# Patient Record
Sex: Male | Born: 1990
Health system: Southern US, Community
[De-identification: ages and names within clinical notes are randomized; demographics above are authoritative.]

## PROBLEM LIST (undated history)

## (undated) DIAGNOSIS — Z789 Other specified health status: Secondary | ICD-10-CM

## (undated) DIAGNOSIS — D649 Anemia, unspecified: Secondary | ICD-10-CM

## (undated) HISTORY — PX: NO PAST SURGERIES: SHX2092

---
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
## 2014-08-18 ENCOUNTER — Emergency Department (HOSPITAL_COMMUNITY)
Admission: EM | Admit: 2014-08-18 | Discharge: 2014-08-18 | Disposition: A | Discharge status: Home / Self Care | Payer: Self-pay | Attending: Emergency Medicine | Admitting: Emergency Medicine

## 2014-08-18 ENCOUNTER — Encounter (HOSPITAL_COMMUNITY): Payer: Self-pay | Admitting: Emergency Medicine

## 2014-08-18 DIAGNOSIS — Z792 Long term (current) use of antibiotics: Secondary | ICD-10-CM | POA: Insufficient documentation

## 2014-08-18 DIAGNOSIS — Y9389 Activity, other specified: Secondary | ICD-10-CM | POA: Insufficient documentation

## 2014-08-18 DIAGNOSIS — K089 Disorder of teeth and supporting structures, unspecified: Secondary | ICD-10-CM | POA: Insufficient documentation

## 2014-08-18 DIAGNOSIS — Y998 Other external cause status: Secondary | ICD-10-CM | POA: Insufficient documentation

## 2014-08-18 DIAGNOSIS — Y929 Unspecified place or not applicable: Secondary | ICD-10-CM | POA: Insufficient documentation

## 2014-08-18 DIAGNOSIS — S0081XA Abrasion of other part of head, initial encounter: Secondary | ICD-10-CM | POA: Insufficient documentation

## 2014-08-18 DIAGNOSIS — W108XXA Fall (on) (from) other stairs and steps, initial encounter: Secondary | ICD-10-CM | POA: Insufficient documentation

## 2014-08-18 DIAGNOSIS — Z720 Tobacco use: Secondary | ICD-10-CM | POA: Insufficient documentation

## 2014-08-18 MED ORDER — CEPHALEXIN 500 MG PO CAPS: 500.0000 mg | ORAL_CAPSULE | Freq: Four times a day (QID) | ORAL | Status: DC

## 2014-08-18 NOTE — ED Notes (Signed)
Pt walked to lobby and stated "I am going to get my family member."

## 2014-08-18 NOTE — ED Provider Notes (Signed)
CSN: [id number]     Arrival date & time 08/18/14  1928 History  This chart was scribed for non-physician practitioner, Dierdre ForthHannah Devlyn Retter, PA-C working with Toy CookeyMegan Docherty, MD by Gwenyth Oberatherine Macek, ED scribe. This patient was seen in room TR09C/TR09C and the patient's care was started at 8:22 PM   Chief Complaint  Patient presents with  . Facial Injury   The history is provided by the patient and medical records. No language interpreter was used.   HPI Comments: Ryan CanaryRonald Warnick is a 23 y.o. male who presents to the Emergency Department complaining of a left lip injury and abrasions after he fell on some steps and hit his head at 2am this morning. Pt notes abrasions to the left side of his face and chipping his front incisor as a result of the fall. He has cleaned the laceration with water, but has not tried any other treatments PTA. Pt's last tetanus shot was 5 months ago when he was imprisoned. He denies LOC, neck pain, back pain, head pain and changes in vision as associated symptoms.    History reviewed. No pertinent past medical history. History reviewed. No pertinent past surgical history. No family history on file. History  Substance Use Topics  . Smoking status: Current Every Day Smoker  . Smokeless tobacco: Not on file  . Alcohol Use: No    Review of Systems  Constitutional: Negative for fever, diaphoresis, appetite change, fatigue and unexpected weight change.  HENT: Positive for dental problem. Negative for mouth sores.   Eyes: Negative for visual disturbance.  Respiratory: Negative for cough, chest tightness, shortness of breath and wheezing.   Cardiovascular: Negative for chest pain.  Gastrointestinal: Negative for nausea, vomiting, abdominal pain, diarrhea and constipation.  Endocrine: Negative for polydipsia, polyphagia and polyuria.  Genitourinary: Negative for dysuria, urgency, frequency and hematuria.  Musculoskeletal: Negative for back pain and neck stiffness.  Skin:  Positive for wound. Negative for rash.  Allergic/Immunologic: Negative for immunocompromised state.  Neurological: Negative for syncope, light-headedness and headaches.  Hematological: Does not bruise/bleed easily.  Psychiatric/Behavioral: Negative for sleep disturbance. The patient is not nervous/anxious.   All other systems reviewed and are negative.     Allergies  Review of patient's allergies indicates no known allergies.  Home Medications   Prior to Admission medications   Medication Sig Start Date End Date Taking? Authorizing Provider  cephALEXin (KEFLEX) 500 MG capsule Take 1 capsule (500 mg total) by mouth 4 (four) times daily. 08/18/14   Demir Titsworth, PA-C   BP 134/77 mmHg  Pulse 99  Temp(Src) 99 F (37.2 C) (Oral)  Resp 17  Ht 5\' 11"  (1.803 m)  Wt 140 lb (63.504 kg)  BMI 19.53 kg/m2  SpO2 100% Physical Exam  Constitutional: He is oriented to person, place, and time. He appears well-developed and well-nourished. No distress.  HENT:  Head: Normocephalic and atraumatic.  Right Ear: Tympanic membrane, external ear and ear canal normal.  Left Ear: Tympanic membrane, external ear and ear canal normal.  Nose: Nose normal. No epistaxis. Right sinus exhibits no maxillary sinus tenderness and no frontal sinus tenderness. Left sinus exhibits no maxillary sinus tenderness and no frontal sinus tenderness.  Mouth/Throat: Uvula is midline, oropharynx is clear and moist and mucous membranes are normal. Mucous membranes are not pale and not cyanotic. No oropharyngeal exudate, posterior oropharyngeal edema, posterior oropharyngeal erythema or tonsillar abscesses.  Left front incisor chipped, but not loose Abrasions to the left side of the for head, left cheek, left lower  lip and central chin  Eyes: Conjunctivae and EOM are normal. Pupils are equal, round, and reactive to light. No scleral icterus.  No horizontal, vertical or rotational nystagmus  Neck: Normal range of motion  and full passive range of motion without pain. Neck supple.  Full active and passive ROM without pain No midline or paraspinal tenderness No nuchal rigidity or meningeal signs  Cardiovascular: Normal rate, regular rhythm, normal heart sounds and intact distal pulses.   No murmur heard. Pulmonary/Chest: Effort normal and breath sounds normal. No stridor. No respiratory distress. He has no wheezes. He has no rales.  Clear and equal breath sounds without focal wheezes, rhonchi, rales  Abdominal: Soft. Bowel sounds are normal. There is no tenderness. There is no rebound and no guarding.  Musculoskeletal: Normal range of motion.  Lymphadenopathy:    He has no cervical adenopathy.  Neurological: He is alert and oriented to person, place, and time. He has normal reflexes. No cranial nerve deficit. He exhibits normal muscle tone. Coordination normal.  Mental Status:  Alert, oriented, thought content appropriate. Speech fluent without evidence of aphasia. Able to follow 2 step commands without difficulty.  Cranial Nerves:  II:  Peripheral visual fields grossly normal, pupils equal, round, reactive to light III,IV, VI: ptosis not present, extra-ocular motions intact bilaterally  V,VII: smile symmetric, facial light touch sensation equal VIII: hearing grossly normal bilaterally  IX,X: gag reflex present  XI: bilateral shoulder shrug equal and strong XII: midline tongue extension  Motor:  5/5 in upper and lower extremities bilaterally including strong and equal grip strength and dorsiflexion/plantar flexion Sensory: Pinprick and light touch normal in all extremities.  Deep Tendon Reflexes: 2+ and symmetric  Cerebellar: normal finger-to-nose with bilateral upper extremities Gait: normal gait and balance CV: distal pulses palpable throughout  No clonus  Skin: Skin is warm and dry. No rash noted. He is not diaphoretic.  Psychiatric: He has a normal mood and affect. His behavior is normal. Judgment  and thought content normal.  Nursing note and vitals reviewed.   ED Course  Procedures (including critical care time) DIAGNOSTIC STUDIES: Oxygen Saturation is 100% on RA, normal by my interpretation.    COORDINATION OF CARE: 8:28 PM Discussed treatment plan with pt at bedside and pt agreed to plan.    Labs Review Labs Reviewed - No data to display  Imaging Review No results found.   EKG Interpretation None      MDM   Final diagnoses:  Abrasion of face, initial encounter   Ryan Cooper presents with abrasions to the left side of face after fall at 2 AM. Patient requests stitches for his left lower lip however there is no laceration for which stretching would be available.  Reports his tetanus is up-to-date. Will give Keflex to ensure no infection to the lip abrasion as it appears macerated.  Neurologic exam. Patient denies neck or back pain. Patient walks without difficulty and no focal neurologic deficits.  I have personally reviewed patient's vitals, nursing note and any pertinent labs or imaging.  I performed an focused physical exam; undressed when appropriate .    It has been determined that no acute conditions requiring further emergency intervention are present at this time. The patient/guardian have been advised of the diagnosis and plan. I reviewed any labs and imaging including any potential incidental findings. We have discussed signs and symptoms that warrant return to the ED and they are listed in the discharge instructions.    Vital signs are  stable at discharge.   BP 134/77 mmHg  Pulse 99  Temp(Src) 99 F (37.2 C) (Oral)  Resp 17  Ht 5\' 11"  (1.803 m)  Wt 140 lb (63.504 kg)  BMI 19.53 kg/m2  SpO2 100%  I personally performed the services described in this documentation, which was scribed in my presence. The recorded information has been reviewed and is accurate.       Dahlia ClientHannah Velma Hanna, PA-C 08/18/14 [id number]  Toy CookeyMegan Docherty, MD 08/19/14 [phone]

## 2014-08-18 NOTE — ED Notes (Signed)
Pt did not return from lobby.

## 2014-08-18 NOTE — ED Notes (Signed)
Pt. lost his balanced/tripped while going down steps and fell yesterday hit his face against pavement , no LOC /ambulatory , presents with abrasions at left forehead/left cheek and lower lip abrasion - no bleeding .

## 2014-08-18 NOTE — Discharge Instructions (Signed)
1. Medications: keflex, usual home medications 2. Treatment: rest, drink plenty of fluids,  3. Follow Up: Please followup with your primary doctor in 3 days for discussion of your diagnoses and further evaluation after today's visit; if you do not have a primary care doctor use the resource guide provided to find one; Please return to the ER for signs of infection    Abrasion An abrasion is a cut or scrape of the skin. Abrasions do not extend through all layers of the skin and most heal within 10 days. It is important to care for your abrasion properly to prevent infection. CAUSES  Most abrasions are caused by falling on, or gliding across, the ground or other surface. When your skin rubs on something, the outer and inner layer of skin rubs off, causing an abrasion. DIAGNOSIS  Your caregiver will be able to diagnose an abrasion during a physical exam.  TREATMENT  Your treatment depends on how large and deep the abrasion is. Generally, your abrasion will be cleaned with water and a mild soap to remove any dirt or debris. An antibiotic ointment may be put over the abrasion to prevent an infection. A bandage (dressing) may be wrapped around the abrasion to keep it from getting dirty.  You may need a tetanus shot if:  You cannot remember when you had your last tetanus shot.  You have never had a tetanus shot.  The injury broke your skin. If you get a tetanus shot, your arm may swell, get red, and feel warm to the touch. This is common and not a problem. If you need a tetanus shot and you choose not to have one, there is a rare chance of getting tetanus. Sickness from tetanus can be serious.  HOME CARE INSTRUCTIONS   If a dressing was applied, change it at least once a day or as directed by your caregiver. If the bandage sticks, soak it off with warm water.   Wash the area with water and a mild soap to remove all the ointment 2 times a day. Rinse off the soap and pat the area dry with a clean  towel.   Reapply any ointment as directed by your caregiver. This will help prevent infection and keep the bandage from sticking. Use gauze over the wound and under the dressing to help keep the bandage from sticking.   Change your dressing right away if it becomes wet or dirty.   Only take over-the-counter or prescription medicines for pain, discomfort, or fever as directed by your caregiver.   Follow up with your caregiver within 24-48 hours for a wound check, or as directed. If you were not given a wound-check appointment, look closely at your abrasion for redness, swelling, or pus. These are signs of infection. SEEK IMMEDIATE MEDICAL CARE IF:   You have increasing pain in the wound.   You have redness, swelling, or tenderness around the wound.   You have pus coming from the wound.   You have a fever or persistent symptoms for more than 2-3 days.  You have a fever and your symptoms suddenly get worse.  You have a bad smell coming from the wound or dressing.  MAKE SURE YOU:   Understand these instructions.  Will watch your condition.  Will get help right away if you are not doing well or get worse. Document Released: 05/16/2005 Document Revised: 07/23/2012 Document Reviewed: 07/10/2011 [facility] Patient Information 2015 [place] [place]. This information is not intended to replace advice given  to you by your health care provider. Make sure you discuss any questions you have with your health care provider.  

## 2017-01-15 ENCOUNTER — Encounter (HOSPITAL_COMMUNITY): Payer: Self-pay | Admitting: Emergency Medicine

## 2017-01-15 ENCOUNTER — Emergency Department (HOSPITAL_COMMUNITY)
Admission: EM | Admit: 2017-01-15 | Discharge: 2017-01-16 | Disposition: A | Discharge status: Home / Self Care | Payer: Self-pay | Attending: Emergency Medicine | Admitting: Emergency Medicine

## 2017-01-15 DIAGNOSIS — Z23 Encounter for immunization: Secondary | ICD-10-CM | POA: Insufficient documentation

## 2017-01-15 DIAGNOSIS — F17200 Nicotine dependence, unspecified, uncomplicated: Secondary | ICD-10-CM | POA: Insufficient documentation

## 2017-01-15 DIAGNOSIS — L720 Epidermal cyst: Secondary | ICD-10-CM | POA: Insufficient documentation

## 2017-01-15 MED ORDER — LIDOCAINE HCL (PF) 1 % IJ SOLN
5.0000 mL | Freq: Once | INTRAMUSCULAR | Status: AC
  Administered 2017-01-15: 5 mL
  Filled 2017-01-15: qty 5

## 2017-01-15 MED ORDER — TETANUS-DIPHTH-ACELL PERTUSSIS 5-2.5-18.5 LF-MCG/0.5 IM SUSP
0.5000 mL | Freq: Once | INTRAMUSCULAR | Status: AC
  Administered 2017-01-15: 0.5 mL via INTRAMUSCULAR
  Filled 2017-01-15: qty 0.5

## 2017-01-15 NOTE — Discharge Instructions (Signed)
We have drained the cyst on your face. Keep the area clean using warm water and antibacterial soap. Return for any problems.

## 2017-01-15 NOTE — ED Provider Notes (Signed)
[facility]-EMERGENCY DEPT Provider Note   CSN: [id number] Arrival date & time: 01/15/17  2143  By signing my name below, I, Rosana Fret, attest that this documentation has been prepared under the direction and in the presence of non-physician practitioner, Kerrie Buffalo, NP. Electronically Signed: Rosana Fret, ED Scribe. 01/15/17. 10:54 PM.  History   Chief Complaint Chief Complaint  Patient presents with  . Abscess   The history is provided by the patient. No language interpreter was used.  Abscess  Location:  Head/neck Size:  1 cm  Abscess quality: fluctuance   Red streaking: no   Progression:  Worsening Chronicity:  New Context: insect bite/sting   Relieved by:  Nothing Ineffective treatments:  None tried Associated symptoms: no fever, no nausea and no vomiting    HPI Comments: Ryan Cooper Sites is a 26 y.o. male who presents to the Emergency Department complaining of a moderate, gradually worsening area of pain and swelling to the left side of his face onset a couple of days ago. Pt believes he may have been bit by an insect. Pt states pain is exacerbated with palpation and direct pressure. No treatments tried prior to arrival in the ED. Pt denies drainage from the area, eye pain or any other complaints at this time.  History reviewed. No pertinent past medical history.  There are no active problems to display for this patient.   History reviewed. No pertinent surgical history.     Home Medications    Prior to Admission medications   Medication Sig Start Date End Date Taking? Authorizing Provider  cephALEXin (KEFLEX) 500 MG capsule Take 1 capsule (500 mg total) by mouth 4 (four) times daily. 08/18/14   Muthersbaugh, Dahlia Client, PA-C    Family History No family history on file.  Social History Social History  Substance Use Topics  . Smoking status: Current Every Day Smoker  . Smokeless tobacco: Never Used  . Alcohol use No     Allergies   Patient has no known  allergies.   Review of Systems Review of Systems  Constitutional: Negative for fever.  Eyes: Negative for pain.  Gastrointestinal: Negative for nausea and vomiting.  Skin: Positive for wound.     Physical Exam Updated Vital Signs BP 129/82   Pulse 84   Temp 99.5 F (37.5 C) (Oral)   Resp 16   Ht 5\' 11"  (1.803 m)   Wt 63.5 kg (140 lb)   SpO2 97%   BMI 19.53 kg/m   Physical Exam  Constitutional: He is oriented to person, place, and time. He appears well-developed and well-nourished. No distress.  HENT:  Head: Normocephalic and atraumatic.  Eyes: EOM are normal.  Neck: Neck supple.  Cardiovascular: Normal rate.   Pulmonary/Chest: Effort normal.  Neurological: He is alert and oriented to person, place, and time.  Skin: Skin is warm and dry.  2 raised fluctuant areas that measure 1 cm each to the left side of the face at the cheek bone.   Psychiatric: He has a normal mood and affect. His behavior is normal.  Nursing note and vitals reviewed.    ED Treatments / Results  DIAGNOSTIC STUDIES: Oxygen Saturation is 97% on RA, normal by my interpretation.   COORDINATION OF CARE: 10:39 PM-Discussed next steps with pt including aspiration and treatment with antibiotics. Pt verbalized understanding and is agreeable with the plan.   Labs (all labs ordered are listed, but only abnormal results are displayed) Labs Reviewed - No data to display  EKG  EKG Interpretation None       Radiology No results found.  Procedures .Marland Kitchen.Incision and Drainage Date/Time: 01/15/2017 10:54 PM Performed by: Janne NapoleonNEESE, HOPE M Authorized by: Janne NapoleonNEESE, HOPE M   Consent:    Consent obtained:  Verbal   Consent given by:  Patient   Risks discussed:  Bleeding, incomplete drainage, infection and pain   Alternatives discussed:  No treatment Location:    Type:  Abscess   Size:  1 cm, 1 cm    Location:  Head   Head location:  Face Pre-procedure details:    Skin preparation:  Antiseptic  wash Anesthesia (see MAR for exact dosages):    Anesthesia method:  Local infiltration   Local anesthetic:  Lidocaine 1% w/o epi Procedure type:    Complexity:  Complex Procedure details:    Needle aspiration: yes     Needle gauge: 27 G.   Incision types:  Single straight   Incision depth:  Subcutaneous   Scalpel blade:  11   Wound management:  Probed and deloculated   Drainage:  Purulent   Drainage amount:  Moderate (thick cheesy drainage)   Wound treatment:  Wound left open   Packing materials:  None Post-procedure details:    Patient tolerance of procedure:  Tolerated well, no immediate complications     (including critical care time)  Medications Ordered in ED Medications  lidocaine (PF) (XYLOCAINE) 1 % injection 5 mL (not administered)  Tdap (BOOSTRIX) injection 0.5 mL (not administered)     Initial Impression / Assessment and Plan / ED Course  I have reviewed the triage vital signs and the nursing notes.  Final Clinical Impressions(s) / ED Diagnoses  26 y.o. male with cystic area to the left side of his face stable for d/c after area drained.   Final diagnoses:  Epidermal cyst of face    New Prescriptions New Prescriptions   No medications on file  I personally performed the services described in this documentation, which was scribed in my presence. The recorded information has been reviewed and is accurate.    Kerrie Buffaloeese, Hope HurleyM, TexasNP 01/15/17 2312    Jacalyn LefevreHaviland, Julie, MD 01/15/17 [phone]

## 2017-01-15 NOTE — ED Triage Notes (Signed)
Pt to ED with c/o two small bumps on left side of face x's 1 1/2 months.  Pt st's painful when he washes his face.  No drainage noted

## 2017-02-01 ENCOUNTER — Encounter (HOSPITAL_COMMUNITY): Payer: Self-pay

## 2017-02-01 ENCOUNTER — Emergency Department (HOSPITAL_COMMUNITY)
Admission: EM | Admit: 2017-02-01 | Discharge: 2017-02-01 | Disposition: A | Discharge status: Home / Self Care | Payer: Self-pay | Attending: Emergency Medicine | Admitting: Emergency Medicine

## 2017-02-01 DIAGNOSIS — M65051 Abscess of tendon sheath, right thigh: Secondary | ICD-10-CM | POA: Insufficient documentation

## 2017-02-01 DIAGNOSIS — Z79899 Other long term (current) drug therapy: Secondary | ICD-10-CM | POA: Insufficient documentation

## 2017-02-01 DIAGNOSIS — L0291 Cutaneous abscess, unspecified: Secondary | ICD-10-CM

## 2017-02-01 DIAGNOSIS — F17200 Nicotine dependence, unspecified, uncomplicated: Secondary | ICD-10-CM | POA: Insufficient documentation

## 2017-02-01 MED ORDER — AMOXICILLIN-POT CLAVULANATE 875-125 MG PO TABS: 1.0000 | ORAL_TABLET | Freq: Two times a day (BID) | ORAL | 0 refills | Status: DC

## 2017-02-01 MED ORDER — LIDOCAINE-EPINEPHRINE (PF) 2 %-1:200000 IJ SOLN
5.0000 mL | Freq: Once | INTRAMUSCULAR | Status: AC
  Administered 2017-02-01: 5 mL
  Filled 2017-02-01: qty 20

## 2017-02-01 NOTE — Discharge Instructions (Signed)
Medications: Augmentin  Treatment: Take Augmentin as prescribed until you finish all medication. Keep dressing applied at all times. Change dressing 1-2 times daily after washing the wound with warm soapy water.  Follow-up: Please return to emergency Department in 2 days for recheck of your wound. Please return sooner if he develop any fever, increasing pain, redness, swelling, red streaking from the area.

## 2017-02-01 NOTE — ED Provider Notes (Signed)
[facility]PT Provider Note   CSN: [id number] Arrival date & time: 02/01/17  0757     History   Chief Complaint Chief Complaint  Patient presents with  . Abscess    HPI Ryan Cooper is a 26 y.o. male who presents with a 2 day history of abscess to right inner thigh. Patient states that area has grown in size and has become more painful over the past 2 days. He reports having history of abscesses in the past. He denies any fevers, chest pain, shortness of breath, abdominal pain, nausea, vomiting, urinary symptoms. He is not tried anything at home for his symptoms.  HPI  History reviewed. No pertinent past medical history.  There are no active problems to display for this patient.   History reviewed. No pertinent surgical history.     Home Medications    Prior to Admission medications   Medication Sig Start Date End Date Taking? Authorizing Provider  amoxicillin-clavulanate (AUGMENTIN) 875-125 MG tablet Take 1 tablet by mouth every 12 (twelve) hours. 02/01/17   Henri Baumler, Waylan BogaAlexandra M, PA-C  cephALEXin (KEFLEX) 500 MG capsule Take 1 capsule (500 mg total) by mouth 4 (four) times daily. 08/18/14   Muthersbaugh, Dahlia ClientHannah, PA-C    Family History No family history on file.  Social History Social History  Substance Use Topics  . Smoking status: Current Every Day Smoker  . Smokeless tobacco: Never Used  . Alcohol use No     Allergies   Patient has no known allergies.   Review of Systems Review of Systems  Constitutional: Negative for chills and fever.  HENT: Negative for facial swelling.   Respiratory: Negative for shortness of breath.   Cardiovascular: Negative for chest pain.  Gastrointestinal: Negative for abdominal pain, nausea and vomiting.  Genitourinary: Negative for scrotal swelling and testicular pain.  Skin: Positive for wound. Negative for rash.  Psychiatric/Behavioral: The patient is not nervous/anxious.      Physical Exam Updated Vital Signs BP  122/72   Pulse 76   Temp 97.5 F (36.4 C)   Resp 18   Ht 6' (1.829 m)   Wt 68 kg (150 lb)   SpO2 98%   BMI 20.34 kg/m   Physical Exam  Constitutional: He appears well-developed and well-nourished. No distress.  HENT:  Head: Normocephalic and atraumatic.  Eyes: Conjunctivae are normal. Right eye exhibits no discharge. Left eye exhibits no discharge. No scleral icterus.  Neck: Normal range of motion. Neck supple. No thyromegaly present.  Cardiovascular: Normal rate, regular rhythm, normal heart sounds and intact distal pulses.  Exam reveals no gallop and no friction rub.   No murmur heard. Pulmonary/Chest: Effort normal and breath sounds normal. No stridor. No respiratory distress. He has no wheezes. He has no rales.  Abdominal: Soft. Bowel sounds are normal. He exhibits no distension. There is no tenderness. There is no rebound and no guarding.  Genitourinary: Testes normal and penis normal.  Musculoskeletal: He exhibits no edema.  Neurological: He is alert. Coordination normal.  Skin: Skin is warm and dry. No rash noted. He is not diaphoretic. No pallor.  1.5 cm area of tenderness and erythema  Psychiatric: He has a normal mood and affect.  Nursing note and vitals reviewed.    ED Treatments / Results  Labs (all labs ordered are listed, but only abnormal results are displayed) Labs Reviewed - No data to display  EKG  EKG Interpretation None       Radiology No results found.  Procedures Procedures (  including critical care time)  INCISION AND DRAINAGE Performed by: Emi Holes Consent: Verbal consent obtained. Risks and benefits: risks, benefits and alternatives were discussed Type: abscess  Body area: R medial thigh  Anesthesia: local infiltration  Incision was made with a scalpel.  Local anesthetic: lidocaine 2% w/ epinephrine  Anesthetic total: 1 ml  Complexity: complex Blunt dissection to break up loculations  Drainage: purulent  Drainage  amount: moderate  Packing material: none  Patient tolerance: Patient tolerated the procedure well with no immediate complications.    Medications Ordered in ED Medications  lidocaine-EPINEPHrine (XYLOCAINE W/EPI) 2 %-1:200000 (PF) injection 5 mL (5 mLs Infiltration Given by Other 02/01/17 0949)     Initial Impression / Assessment and Plan / ED Course  I have reviewed the triage vital signs and the nursing notes.  Pertinent labs & imaging results that were available during my care of the patient were reviewed by me and considered in my medical decision making (see chart for details).     Patient with skin abscess. No involvement of scrotum. Incision and drainage performed in the ED today.  Abscess was not large enough to warrant packing or drain placement. Wound recheck in 2 days. Supportive care and return precautions discussed.  Pt sent home with Augmentin for anaerobe coverage. The patient appears reasonably screened and/or stabilized for discharge and I doubt any other emergent medical condition requiring further screening, evaluation, or treatment in the ED prior to discharge. I discussed patient case with Dr. Madilyn Hook who guided the patient's management and agrees with plan.   Final Clinical Impressions(s) / ED Diagnoses   Final diagnoses:  Abscess    New Prescriptions Discharge Medication List as of 02/01/2017  9:44 AM    START taking these medications   Details  amoxicillin-clavulanate (AUGMENTIN) 875-125 MG tablet Take 1 tablet by mouth every 12 (twelve) hours., Starting Fri 02/01/2017, Print         Silvanna Ohmer, Peter, PA-C 02/01/17 1111    Tilden Fossa, MD 02/02/17 1031

## 2017-02-01 NOTE — ED Triage Notes (Signed)
Pt reports abscess to right medial thigh that he noticed yesterday

## 2017-08-05 ENCOUNTER — Ambulatory Visit (HOSPITAL_COMMUNITY)
Admission: EM | Admit: 2017-08-05 | Discharge: 2017-08-05 | Disposition: A | Discharge status: Home / Self Care | Payer: Self-pay | Attending: Family Medicine | Admitting: Family Medicine

## 2017-08-05 ENCOUNTER — Encounter (HOSPITAL_COMMUNITY): Payer: Self-pay | Admitting: *Deleted

## 2017-08-05 ENCOUNTER — Other Ambulatory Visit: Payer: Self-pay

## 2017-08-05 DIAGNOSIS — Z202 Contact with and (suspected) exposure to infections with a predominantly sexual mode of transmission: Secondary | ICD-10-CM | POA: Insufficient documentation

## 2017-08-05 DIAGNOSIS — F17200 Nicotine dependence, unspecified, uncomplicated: Secondary | ICD-10-CM | POA: Insufficient documentation

## 2017-08-05 MED ORDER — LIDOCAINE HCL (PF) 1 % IJ SOLN
INTRAMUSCULAR | Status: AC
  Filled 2017-08-05: qty 2

## 2017-08-05 MED ORDER — CEFTRIAXONE SODIUM 250 MG IJ SOLR
250.0000 mg | Freq: Once | INTRAMUSCULAR | Status: AC
  Administered 2017-08-05: 250 mg via INTRAMUSCULAR

## 2017-08-05 MED ORDER — AZITHROMYCIN 250 MG PO TABS
ORAL_TABLET | ORAL | Status: AC
  Filled 2017-08-05: qty 4

## 2017-08-05 MED ORDER — CEFTRIAXONE SODIUM 250 MG IJ SOLR
INTRAMUSCULAR | Status: AC
  Filled 2017-08-05: qty 250

## 2017-08-05 MED ORDER — AZITHROMYCIN 250 MG PO TABS
1000.0000 mg | ORAL_TABLET | Freq: Once | ORAL | Status: AC
Start: 2017-08-05 — End: 2017-08-05
  Administered 2017-08-05: 1000 mg via ORAL

## 2017-08-05 MED ORDER — METRONIDAZOLE 500 MG PO TABS: 500.0000 mg | ORAL_TABLET | Freq: Two times a day (BID) | ORAL | 0 refills | Status: AC

## 2017-08-05 NOTE — ED Provider Notes (Signed)
[facility]    CSN: [id number] Arrival date & time: 08/05/17  1044     History   Chief Complaint Chief Complaint  Patient presents with  . Exposure to STD    HPI Ryan Cooper is a 26 y.o. male presenting after possible exposure to trichomonas. His partner gave him a card stating he had been exposed. He denies having any symptoms- denies penile discharge, dysuria, penile pain, testicular pain, new rashes or lesions, no abdominal pain, nausea, vomiting, fevers.  HPI  History reviewed. No pertinent past medical history.  There are no active problems to display for this patient.   History reviewed. No pertinent surgical history.     Home Medications    Prior to Admission medications   Medication Sig Start Date End Date Taking? Authorizing Provider  amoxicillin-clavulanate (AUGMENTIN) 875-125 MG tablet Take 1 tablet by mouth every 12 (twelve) hours. 02/01/17   Law, Waylan BogaAlexandra M, PA-C  cephALEXin (KEFLEX) 500 MG capsule Take 1 capsule (500 mg total) by mouth 4 (four) times daily. 08/18/14   Muthersbaugh, Dahlia ClientHannah, PA-C  metroNIDAZOLE (FLAGYL) 500 MG tablet Take 1 tablet (500 mg total) by mouth 2 (two) times daily for 7 days. 08/05/17 08/12/17  Wieters, Junius CreamerHallie C, PA-C    Family History History reviewed. No pertinent family history.  Social History Social History   Tobacco Use  . Smoking status: Current Every Day Smoker  . Smokeless tobacco: Never Used  Substance Use Topics  . Alcohol use: No  . Drug use: No     Allergies   Patient has no known allergies.   Review of Systems Review of Systems  Constitutional: Negative for chills and fever.  HENT: Negative for ear pain and sore throat.   Eyes: Negative for pain and visual disturbance.  Respiratory: Negative for cough and shortness of breath.   Cardiovascular: Negative for chest pain and palpitations.  Gastrointestinal: Negative for abdominal pain and vomiting.  Genitourinary: Negative for difficulty  urinating, discharge, dysuria, hematuria, penile pain and testicular pain.  Musculoskeletal: Negative for arthralgias and back pain.  Skin: Negative for color change and rash.  Neurological: Negative for dizziness, light-headedness and headaches.  All other systems reviewed and are negative.    Physical Exam Triage Vital Signs ED Triage Vitals  Enc Vitals Group     BP 08/05/17 1145 118/73     Pulse Rate 08/05/17 1145 80     Resp --      Temp 08/05/17 1145 98.1 F (36.7 C)     Temp Source 08/05/17 1145 Oral     SpO2 08/05/17 1145 100 %     Weight --      Height --      Head Circumference --      Peak Flow --      Pain Score 08/05/17 1218 0     Pain Loc --      Pain Edu? --      Excl. in GC? --    No data found.  Updated Vital Signs BP 118/73 (BP Location: Left Arm)   Pulse 80   Temp 98.1 F (36.7 C) (Oral)   SpO2 100%    Physical Exam  Constitutional: He appears well-developed and well-nourished.  HENT:  Head: Normocephalic and atraumatic.  Eyes: Conjunctivae are normal.  Neck: Neck supple.  Cardiovascular: Normal rate and regular rhythm.  No murmur heard. Pulmonary/Chest: Effort normal and breath sounds normal. No respiratory distress.  Abdominal: Soft. There is no tenderness.  Genitourinary:  Genitourinary Comments: Deferred- no symptoms or lesions per patient  Musculoskeletal: He exhibits no edema.  Neurological: He is alert.  Skin: Skin is warm and dry.  Psychiatric: He has a normal mood and affect.  Nursing note and vitals reviewed.    UC Treatments / Results  Labs (all labs ordered are listed, but only abnormal results are displayed) Labs Reviewed  URINE CYTOLOGY ANCILLARY ONLY    EKG  EKG Interpretation None       Radiology No results found.  Procedures Procedures (including critical care time)  Medications Ordered in UC Medications  cefTRIAXone (ROCEPHIN) injection 250 mg (250 mg Intramuscular Given 08/05/17 1216)  azithromycin  (ZITHROMAX) tablet 1,000 mg (1,000 mg Oral Given 08/05/17 1216)     Initial Impression / Assessment and Plan / UC Course  I have reviewed the triage vital signs and the nursing notes.  Pertinent labs & imaging results that were available during my care of the patient were reviewed by me and considered in my medical decision making (see chart for details).     Patient tested for GC/Chlamydia/Trichomonas. Patient requested to be empirically treated for all. Rocephin and azithromycin given today. Metronidazole BID x 7 days prescribed for Trich.  Final Clinical Impressions(s) / UC Diagnoses   Final diagnoses:  STD exposure    ED Discharge Orders        Ordered    metroNIDAZOLE (FLAGYL) 500 MG tablet  2 times daily    Comments:  Please give generic   08/05/17 1206       Controlled Substance Prescriptions [place] Controlled Substance Registry consulted? Not Applicable   Lew DawesWieters, Hallie C, [place] JerseyPA-C 08/05/17 1407

## 2017-08-05 NOTE — Discharge Instructions (Signed)
We have treated you today for gonorrhea and chlamydia, with rocephin injection and azithromycin pills. Please pick up metronidazole pills for Trichomonas treatment.   We are testing you for Gonorrhea, Chlamydia, Trichomonas. We will call you if anything is positive and let you know if you require any further treatment.   Please return if symptoms not improving with treatment, development of fever, nausea, vomiting, abdominal pain.

## 2017-08-05 NOTE — ED Triage Notes (Signed)
Per pt he got a paper from a clinic informing that he needed to get treated for STD. Per pt he is not having any sx

## 2017-08-06 LAB — URINE CYTOLOGY ANCILLARY ONLY
CHLAMYDIA, DNA PROBE: NEGATIVE
Neisseria Gonorrhea: NEGATIVE
Trichomonas: NEGATIVE

## 2017-10-27 ENCOUNTER — Emergency Department (HOSPITAL_COMMUNITY)
Admission: EM | Admit: 2017-10-27 | Discharge: 2017-10-27 | Disposition: A | Discharge status: Home / Self Care | Payer: Self-pay | Attending: Emergency Medicine | Admitting: Emergency Medicine

## 2017-10-27 ENCOUNTER — Encounter (HOSPITAL_COMMUNITY): Payer: Self-pay | Admitting: Emergency Medicine

## 2017-10-27 DIAGNOSIS — Z5321 Procedure and treatment not carried out due to patient leaving prior to being seen by health care provider: Secondary | ICD-10-CM | POA: Insufficient documentation

## 2017-10-27 DIAGNOSIS — R600 Localized edema: Secondary | ICD-10-CM | POA: Insufficient documentation

## 2017-10-27 NOTE — ED Notes (Signed)
Called name in lobby x1 no answer

## 2017-10-27 NOTE — ED Triage Notes (Signed)
Pt states cyst to left side of face for months, has had it lanced here a few months ago. Cyst remained, but now swelling has traveled to under eye and cheek. Swelling noted.

## 2017-10-28 ENCOUNTER — Emergency Department (HOSPITAL_COMMUNITY)
Admission: EM | Admit: 2017-10-28 | Discharge: 2017-10-28 | Disposition: A | Discharge status: Home / Self Care | Payer: Self-pay | Attending: Emergency Medicine | Admitting: Emergency Medicine

## 2017-10-28 ENCOUNTER — Encounter (HOSPITAL_COMMUNITY): Payer: Self-pay

## 2017-10-28 ENCOUNTER — Other Ambulatory Visit: Payer: Self-pay

## 2017-10-28 DIAGNOSIS — L0201 Cutaneous abscess of face: Secondary | ICD-10-CM | POA: Insufficient documentation

## 2017-10-28 DIAGNOSIS — L0291 Cutaneous abscess, unspecified: Secondary | ICD-10-CM

## 2017-10-28 DIAGNOSIS — F17210 Nicotine dependence, cigarettes, uncomplicated: Secondary | ICD-10-CM | POA: Insufficient documentation

## 2017-10-28 MED ORDER — SULFAMETHOXAZOLE-TRIMETHOPRIM 800-160 MG PO TABS: 1.0000 | ORAL_TABLET | Freq: Two times a day (BID) | ORAL | 0 refills | Status: AC

## 2017-10-28 NOTE — ED Triage Notes (Signed)
Pt seen here yesterday for a cyst to the left side of his face. Pt is back today because the area continues to swell. Swelling noted. Pt afebrile. Pt does have some swelling noted and swelling noted under the left eye.

## 2017-10-28 NOTE — ED Notes (Signed)
Pt. Not in room when going to discharge

## 2017-10-28 NOTE — Discharge Instructions (Signed)
Please read attached information. If you experience any new or worsening signs or symptoms please return to the emergency room for evaluation. Please follow-up with your primary care provider or specialist as discussed. Please use medication prescribed only as directed and discontinue taking if you have any concerning signs or symptoms.   °

## 2017-10-28 NOTE — ED Provider Notes (Signed)
[facility] EMERGENCY DEPARTMENT Provider Note   CSN: [id number] Arrival date & time: 10/28/17  1623     History   Chief Complaint Chief Complaint  Patient presents with  . Facial Swelling    HPI Thanh Mottern is a 27 y.o. male.  HPI   27 year old male presents today with complaints of facial swelling.  Patient notes yesterday he developed a "cyst" to his left lateral eye and face.  He notes the similar to previous that have popped on their own.  He notes attempting to pop the cyst without improvement and only worsening of symptoms.  He notes continued swelling, pain, denies any fever chills nausea or vomiting.  Denies any painful ocular movements or ocular involvement.  Patient does note some edema to the lower eye.  History of significant skin infections in the past.  History reviewed. No pertinent past medical history.  There are no active problems to display for this patient.   History reviewed. No pertinent surgical history.     Home Medications    Prior to Admission medications   Medication Sig Start Date End Date Taking? Authorizing Provider  amoxicillin-clavulanate (AUGMENTIN) 875-125 MG tablet Take 1 tablet by mouth every 12 (twelve) hours. 02/01/17   Law, Waylan Boga, PA-C  cephALEXin (KEFLEX) 500 MG capsule Take 1 capsule (500 mg total) by mouth 4 (four) times daily. 08/18/14   Muthersbaugh, Dahlia Client, PA-C  sulfamethoxazole-trimethoprim (BACTRIM DS,SEPTRA DS) 800-160 MG tablet Take 1 tablet by mouth 2 (two) times daily for 7 days. 10/28/17 11/04/17  Eyvonne Mechanic, PA-C    Family History History reviewed. No pertinent family history.  Social History Social History   Tobacco Use  . Smoking status: Current Every Day Smoker  . Smokeless tobacco: Never Used  Substance Use Topics  . Alcohol use: No  . Drug use: No     Allergies   Patient has no known allergies.   Review of Systems Review of Systems  All other systems reviewed and are  negative.    Physical Exam Updated Vital Signs BP 130/79 (BP Location: Right Arm)   Pulse 85   Temp 98.5 F (36.9 C) (Oral)   Resp 16   SpO2 98%   Physical Exam  Constitutional: He is oriented to person, place, and time. He appears well-developed and well-nourished.  HENT:  Head: Normocephalic and atraumatic.  1 cm area of raised induration with redness left zygomatic temporal region-no ocular involvement extraocular movements intact pain-free no discharge no fluctuance  Eyes: Conjunctivae are normal. Pupils are equal, round, and reactive to light. Right eye exhibits no discharge. Left eye exhibits no discharge. No scleral icterus.  Neck: Normal range of motion. No JVD present. No tracheal deviation present.  Pulmonary/Chest: Effort normal. No stridor.  Neurological: He is alert and oriented to person, place, and time. Coordination normal.  Psychiatric: He has a normal mood and affect. His behavior is normal. Judgment and thought content normal.  Nursing note and vitals reviewed.  ED Treatments / Results  Labs (all labs ordered are listed, but only abnormal results are displayed) Labs Reviewed - No data to display  EKG  EKG Interpretation None       Radiology No results found.  Procedures Procedures (including critical care time)  EMERGENCY DEPARTMENT US SOFT TISSUE INTERPRETATION "Study: Limited Soft Tissue Ultrasound"  INDICATIONS: Soft tissue infection Multiple views of the body part were obtained in real-time with a multi-frequency linear probe  PERFORMED BY: Myself IMAGES ARCHIVED?: Yes SIDE:Left  BODY PART:face INTERPRETATION:  No abcess noted     Medications Ordered in ED Medications - No data to display   Initial Impression / Assessment and Plan / ED Course  I have reviewed the triage vital signs and the nursing notes.  Pertinent labs & imaging results that were available during my care of the patient were reviewed by me and considered in my  medical decision making (see chart for details).      Final Clinical Impressions(s) / ED Diagnoses   Final diagnoses:  Abscess    27 year old male presents today with abscess to the face.  This is likely mostly induration and traumatic tissue secondary patient attempting to extract it on his own.  Bedside ultrasound shows no drainable fluid collection.  Patient with no intraocular, localized to the area noted.  Patient will be encouraged to use warm compress, antibiotics, return if symptoms worsen or do not improve.  Patient verbalized understanding and agreement to today's plan had no further questions or concerns.  ED Discharge Orders        Ordered    sulfamethoxazole-trimethoprim (BACTRIM DS,SEPTRA DS) 800-160 MG tablet  2 times daily     10/28/17 1917       Eyvonne MechanicHedges, Derionna Salvador, Cordelia Poche-C 10/28/17 1918    Azalia Bilisampos, Kevin, MD 10/28/17 2250

## 2019-08-29 ENCOUNTER — Other Ambulatory Visit: Payer: Self-pay

## 2019-08-29 ENCOUNTER — Emergency Department (HOSPITAL_COMMUNITY)
Admission: EM | Admit: 2019-08-29 | Discharge: 2019-08-29 | Disposition: A | Discharge status: Home / Self Care | Payer: Self-pay | Attending: Emergency Medicine | Admitting: Emergency Medicine

## 2019-08-29 ENCOUNTER — Encounter (HOSPITAL_COMMUNITY): Payer: Self-pay | Admitting: Emergency Medicine

## 2019-08-29 DIAGNOSIS — L0201 Cutaneous abscess of face: Secondary | ICD-10-CM | POA: Insufficient documentation

## 2019-08-29 DIAGNOSIS — L03211 Cellulitis of face: Secondary | ICD-10-CM | POA: Insufficient documentation

## 2019-08-29 DIAGNOSIS — F17200 Nicotine dependence, unspecified, uncomplicated: Secondary | ICD-10-CM | POA: Insufficient documentation

## 2019-08-29 DIAGNOSIS — R220 Localized swelling, mass and lump, head: Secondary | ICD-10-CM | POA: Insufficient documentation

## 2019-08-29 MED ORDER — LIDOCAINE HCL (PF) 1 % IJ SOLN
5.0000 mL | Freq: Once | INTRAMUSCULAR | Status: AC
  Administered 2019-08-29: 5 mL via INTRADERMAL
  Filled 2019-08-29: qty 5

## 2019-08-29 MED ORDER — CLINDAMYCIN HCL 300 MG PO CAPS: 300.0000 mg | ORAL_CAPSULE | Freq: Four times a day (QID) | ORAL | 0 refills | Status: DC

## 2019-08-29 MED ORDER — IBUPROFEN 200 MG PO TABS
600.0000 mg | ORAL_TABLET | Freq: Once | ORAL | Status: AC
Start: 2019-08-29 — End: 2019-08-29
  Administered 2019-08-29: 600 mg via ORAL
  Filled 2019-08-29: qty 1

## 2019-08-29 NOTE — ED Triage Notes (Signed)
C/o abscess to L side of face/jaw x 1 week.  Denies fever/chills.

## 2019-08-29 NOTE — Discharge Instructions (Addendum)
Take antibiotics as discussed and follow-up closely with your provider on Monday whether it be a dentist or return to the emergency department for reassessment. If you develop breathing difficulty, swelling underneath your tongue area, inability to open your mouth at all or new concerns return the emergency room.  Take Tylenol and ibuprofen together as needed every 6 hours.

## 2019-08-29 NOTE — ED Provider Notes (Signed)
[facility] EMERGENCY DEPARTMENT Provider Note   CSN: [id number] Arrival date & time: 08/29/19  1637     History Chief Complaint  Patient presents with  . Abscess    Ryan Cooper is a 29 y.o. male.  Patient presents with left jaw tenderness and swelling gradually worsening the past week.  Started off as a pimple and then spread along his jaw and recently has become more firm.  No history of similar.  No dental pain.  Patient does smoke cigarettes.  No other significant medical history.  No breathing difficulty.  No swelling under his tongue or mandible.        History reviewed. No pertinent past medical history.  There are no problems to display for this patient.   History reviewed. No pertinent surgical history.     No family history on file.  Social History   Tobacco Use  . Smoking status: Current Every Day Smoker  . Smokeless tobacco: Never Used  Substance Use Topics  . Alcohol use: No  . Drug use: No    Home Medications Prior to Admission medications   Medication Sig Start Date End Date Taking? Authorizing Provider  amoxicillin-clavulanate (AUGMENTIN) 875-125 MG tablet Take 1 tablet by mouth every 12 (twelve) hours. 02/01/17   Law, Bea Graff, PA-C  cephALEXin (KEFLEX) 500 MG capsule Take 1 capsule (500 mg total) by mouth 4 (four) times daily. 08/18/14   Muthersbaugh, Jarrett Soho, PA-C  clindamycin (CLEOCIN) 300 MG capsule Take 1 capsule (300 mg total) by mouth 4 (four) times daily. X 7 days 08/29/19   Elnora Morrison, MD    Allergies    Patient has no known allergies.  Review of Systems   Review of Systems  Constitutional: Negative for chills and fever.  HENT: Positive for facial swelling. Negative for congestion.   Eyes: Negative for visual disturbance.  Respiratory: Negative for shortness of breath.   Cardiovascular: Negative for chest pain.  Gastrointestinal: Negative for abdominal pain and vomiting.  Genitourinary: Negative for  dysuria and flank pain.  Musculoskeletal: Negative for back pain, neck pain and neck stiffness.  Skin: Negative for rash.  Neurological: Negative for light-headedness and headaches.    Physical Exam Updated Vital Signs BP (!) 141/84 (BP Location: Right Arm)   Pulse 81   Temp 98.9 F (37.2 C) (Oral)   Resp 18   SpO2 100%   Physical Exam Vitals and nursing note reviewed.  Constitutional:      Appearance: He is well-developed.  HENT:     Head: Normocephalic.     Comments: Patient has approximate area of 4 cm along the left jaw with mild swelling, mild firmness centrally and mild tenderness.  No crepitus.  No trismus.  No tenderness to gingiva upper and lower on that side.  No swelling or tenderness submandibular.  Patient has mild anterior cervical adenopathy on the left.  Neck supple no meningismus. Eyes:     General:        Right eye: No discharge.        Left eye: No discharge.     Conjunctiva/sclera: Conjunctivae normal.  Neck:     Trachea: No tracheal deviation.  Cardiovascular:     Rate and Rhythm: Normal rate.  Pulmonary:     Effort: Pulmonary effort is normal.  Musculoskeletal:     Cervical back: Normal range of motion and neck supple.  Skin:    General: Skin is warm.     Findings: No rash.  Neurological:  Mental Status: He is alert and oriented to person, place, and time.     ED Results / Procedures / Treatments   Labs (all labs ordered are listed, but only abnormal results are displayed) Labs Reviewed - No data to display  EKG None  Radiology No results found.  Procedures Ultrasound ED Soft Tissue  Date/Time: 08/29/2019 7:10 PM Performed by: Blane Ohara, MD Authorized by: Blane Ohara, MD   Procedure details:    Indications: localization of abscess and evaluate for cellulitis     Transverse view:  Visualized   Longitudinal view:  Visualized   Images: archived   Location:    Location: face     Side:  Left Findings:     cellulitis  present .Marland KitchenIncision and Drainage  Date/Time: 08/29/2019 7:10 PM Performed by: Blane Ohara, MD Authorized by: Blane Ohara, MD   Consent:    Consent obtained:  Verbal   Consent given by:  Patient   Risks discussed:  Bleeding, incomplete drainage, infection, pain and damage to other organs   Alternatives discussed:  No treatment Location:    Type:  Abscess   Size:  1   Location:  Head   Head location:  Face Pre-procedure details:    Skin preparation:  Chloraprep Anesthesia (see MAR for exact dosages):    Anesthesia method:  Local infiltration   Local anesthetic:  Lidocaine 1% w/o epi Procedure type:    Complexity:  Simple Procedure details:    Needle aspiration: yes     Needle size:  18 G   Incision types:  Stab incision   Incision depth:  Dermal   Drainage:  Bloody   Drainage amount:  Scant   Wound treatment:  Wound left open   Packing materials:  None   (including critical care time)  Medications Ordered in ED Medications  lidocaine (PF) (XYLOCAINE) 1 % injection 5 mL (has no administration in time range)  ibuprofen (ADVIL) tablet 600 mg (600 mg Oral Given 08/29/19 1828)    ED Course  I have reviewed the triage vital signs and the nursing notes.  Pertinent labs & imaging results that were available during my care of the patient were reviewed by me and considered in my medical decision making (see chart for details).    MDM Rules/Calculators/A&P                     Patient presents with clinically facial cellulitis and possible early abscess.  Bedside ultrasound showed cellulitis with minimal fluid collection.  Plan for 18-gauge needle to see if any purulence.  Discussed will need recheck in 48 hours in case he needs more significant incision and drainage.  Plan for antibiotics to follow-up with ENT or return the emergency room.  Needle 18-gauge utilized to test for purulence, negative.  No indication for incision with scalpel at this time.  Plan for clindamycin and  recheck on Monday.   Final Clinical Impression(s) / ED Diagnoses Final diagnoses:  Facial cellulitis    Rx / DC Orders ED Discharge Orders         Ordered    clindamycin (CLEOCIN) 300 MG capsule  4 times daily     08/29/19 Amalia Greenhouse, MD 08/29/19 1911

## 2019-08-29 NOTE — ED Notes (Signed)
Lidocaine at bedside for MD

## 2019-08-29 NOTE — ED Notes (Signed)
ED Provider at bedside. 

## 2019-09-08 ENCOUNTER — Encounter: Payer: Self-pay | Admitting: Student

## 2019-09-08 ENCOUNTER — Ambulatory Visit: Payer: Self-pay | Admitting: Student

## 2019-09-08 DIAGNOSIS — S02652B Fracture of angle of left mandible, initial encounter for open fracture: Secondary | ICD-10-CM

## 2019-09-08 NOTE — H&P (Signed)
.  History and Physical    Name: Lev Cervone MRN: [id number]  Date:  09/08/2019            DOB: Sep 20, 1990   Past Medical History:  Unremarkable  Past Surgical History:  None  Medications:  - Clindamycin (for current jaw fracture)  Allergies:  No Known Allergies  Social History:  Tobacco: 3-4 cigarettes per day EtOH: 1 drink per month Illicit Drug: Marijuana weekly   Review of System:   CONSTITUTIONAL: None  NERVOUS SYSTEM:CN II-XII grossly intact, denies hypoesthesia or anesthesia of the CN V.   EYES:None  EARS/NOSE/THROAT: None  HEART:None  LUNG: None  STOMACH/BOWEL: None  GENITOURINARY: None  SKIN: None  MUSCLE/BONES: None   History of Present Illness:    Physical Exam  Ht: 5\' 11"   Wt: 140 lb (36.6 kg)  Review of System  Physical Exam  HEENT: CN II-XII grossly intact, denies hypoesthesia or anesthesia of the CN V. Edema of the left angle of the mandible. Malocclusion  Bilaterally. Patient able to be guided into centric occlusion with stable Class I occlusion. Bilateral wisdom teeth are visible in the oral cavity.  CV: RRR Pulm: CTAB Abd: Non-distended Extremities: WWP  Objective:     Panoramic Radiographic: - left mandibular angle fracture with minimal displacement through impacted tooth #17  Assessment:     Left Mandible Fracture- open Impacted third molars     Plan:     Patient will require ORIF of the left angle fracture with extraction of impacted third molars. He has been posed for the OR on Wednesday 09/16/19.     Anesthesia: - General Anesthesia via NETT - Paralytic is not necessary for this case - Patient will be turned 90 degrees - Patient will not be wired shut after the case - Plan for Clindamycin pre-operatively    Post-op Instructions:  - Patient should maintain a soft, non-chew diet for 6 weeks after surgery  - He should refrain from activities that may put his jaw at risk of injury  - Sutures placed intraoperatively  will dissolve and their own within 2 weeks  - Please provide prescription for peridex mouthrinse (0.12% Chlorhexidine rinse) to be used twice daily for 2 weeks post-op  - Please provide 7 day course of amoxicillin post-operatively  - Defer pain management to primary team, but most Mandibular ORIF require Less than 5 days of nacotics   Follow up: Patient should follow up in 1-2 weeks at our office below. Please provide contact information to patient  Dr. 09/18/19  [facility]  [phone]  [address] [place], [place]

## 2019-09-11 NOTE — Pre-Procedure Instructions (Signed)
[place], [place] [address] Phone: [phone] Fax: [phone]    Your procedure is scheduled on Wed., Jan. 27, 2021 from 10:00AM-12:55PM  Report to [facility] Entrance "A" at 8:00AM  Call this number if you have problems the morning of surgery:  [phone]   Remember:  Do not eat or drink after midnight on Jan. 26th    Take these medicines the morning of surgery with A SIP OF WATER: If Needed: Acetaminophen (TYLENOL)   As of today, stop taking all Aspirin (unless instructed by your doctor) and Other Aspirin containing products, Vitamins, Fish oils, and Herbal medications. Also stop all NSAIDS i.e. Advil, Ibuprofen, Motrin, Aleve, Anaprox, Naproxen, BC, Goody Powders, and all Supplements.  No Smoking of any kind, Tobacco, or Alcohol products 24 hours prior to your procedure. If you use a Cpap at night, you may bring the machine and all equipment for your overnight stay.   Special instructions:   [facility]- Preparing For Surgery  Before surgery, you can play an important role. Because skin is not sterile, your skin needs to be as free of germs as possible. You can reduce the number of germs on your skin by washing with CHG (chlorahexidine gluconate) Soap before surgery.  CHG is an antiseptic cleaner which kills germs and bonds with the skin to continue killing germs even after washing.    Please do not use if you have an allergy to CHG or antibacterial soaps. If your skin becomes reddened/irritated stop using the CHG.  Do not shave (including legs and underarms) for at least 48 hours prior to first CHG shower. It is OK to shave your face.  Please follow these instructions carefully.   1. Shower the NIGHT BEFORE SURGERY and the MORNING OF SURGERY with CHG.   2. If you chose to wash your hair, wash your hair first as usual with your normal shampoo.  3. After you shampoo,  rinse your hair and body thoroughly to remove the shampoo.  4. Use CHG as you would any other liquid soap. You can apply CHG directly to the skin and wash gently with a scrungie or a clean washcloth.   5. Apply the CHG Soap to your body ONLY FROM THE NECK DOWN.  Do not use on open wounds or open sores. Avoid contact with your eyes, ears, mouth and genitals (private parts). Wash Face and genitals (private parts)  with your normal soap.  6. Wash thoroughly, paying special attention to the area where your surgery will be performed.  7. Thoroughly rinse your body with warm water from the neck down.  8. DO NOT shower/wash with your normal soap after using and rinsing off the CHG Soap.  9. Pat yourself dry with a CLEAN TOWEL.  10. Wear CLEAN PAJAMAS to bed the night before surgery, wear comfortable clothes the morning of surgery  11. Place CLEAN SHEETS on your bed the night of your first shower and DO NOT SLEEP WITH PETS.   Day of Surgery:            Remember to brush your teeth WITH YOUR REGULAR TOOTHPASTE.  Do not wear jewelry.  Do not wear lotions, powders, colognes, or deodorant.  Do not shave 48 hours prior to surgery.  Men may shave face and neck.  Do not bring valuables to the hospital.  [facility] is not responsible for any belongings or valuables.  Contacts,  dentures or bridgework may not be worn into surgery.    For patients admitted to the hospital, discharge time will be determined by your treatment team.  Patients discharged the day of surgery will not be allowed to drive home, and someone age 27 and over needs to stay with them for 24 hours.  Please wear clean clothes to the hospital/surgery center.    Please read over the following fact sheets that you were given.

## 2019-09-14 ENCOUNTER — Inpatient Hospital Stay (HOSPITAL_COMMUNITY)
Admission: RE | Admit: 2019-09-14 | Discharge: 2019-09-14 | Disposition: A | Discharge status: Home / Self Care | Payer: Self-pay | Source: Ambulatory Visit

## 2019-09-14 ENCOUNTER — Inpatient Hospital Stay (HOSPITAL_COMMUNITY): Admission: RE | Admit: 2019-09-14 | Payer: Self-pay | Source: Ambulatory Visit

## 2019-09-14 NOTE — Progress Notes (Signed)
Sent message to Dr. Julien Girt for ordrs.

## 2019-09-15 ENCOUNTER — Encounter (HOSPITAL_COMMUNITY): Payer: Self-pay

## 2019-09-15 ENCOUNTER — Other Ambulatory Visit: Payer: Self-pay

## 2019-09-15 ENCOUNTER — Encounter (HOSPITAL_COMMUNITY)
Admission: RE | Admit: 2019-09-15 | Discharge: 2019-09-15 | Disposition: A | Discharge status: Home / Self Care | Payer: Self-pay | Source: Ambulatory Visit | Attending: Student | Admitting: Student

## 2019-09-15 ENCOUNTER — Other Ambulatory Visit (HOSPITAL_COMMUNITY)
Admission: RE | Admit: 2019-09-15 | Discharge: 2019-09-15 | Disposition: A | Discharge status: Home / Self Care | Payer: Self-pay | Source: Ambulatory Visit | Attending: Student | Admitting: Student

## 2019-09-15 DIAGNOSIS — Z20822 Contact with and (suspected) exposure to covid-19: Secondary | ICD-10-CM | POA: Insufficient documentation

## 2019-09-15 DIAGNOSIS — Z01812 Encounter for preprocedural laboratory examination: Secondary | ICD-10-CM | POA: Insufficient documentation

## 2019-09-15 HISTORY — DX: Other specified health status: Z78.9

## 2019-09-15 LAB — SARS CORONAVIRUS 2 (TAT 6-24 HRS): SARS Coronavirus 2: NEGATIVE

## 2019-09-15 LAB — HEMOGLOBIN: Hemoglobin: 14.6 g/dL (ref 13.0–17.0)

## 2019-09-15 NOTE — Progress Notes (Addendum)
PCP - None  Cardiologist - none  Chest x-ray - na  EKG - na  Stress Test -   ECHO - na  Cardiac Cath - na  Sleep Study - no CPAP - no  LABS-hemoglobin  ASA-no  ERAS-no  HA1C- na Fasting Blood Sugar - na Checks Blood Sugar _0____ times a day  Anesthesia-  Pt denies having chest pain, sob, or fever at this time. All instructions explained to the pt, with a verbal understanding of the material. Pt agrees to go over the instructions while at home for a better understanding. Pt also instructed to self quarantine after being tested for COVID-19. The opportunity to ask questions was provided. Ryan Cooper said he is going for Covid test after PAT appointment.  Ryan Cooper did not go for Covid test. A screener called patient at 1450 and told him he needed to come before 1530, patient said he would come right then, he didn't. I notified Dr.M<cDaniel.

## 2019-09-15 NOTE — Progress Notes (Signed)
Spoke with patient about getting his COVID test completed before his surgery.  Explained to the patient that he would need to arrive today before we close to get his covid test completed.  Patient stated that he will leave work now to get his test completed

## 2019-09-15 NOTE — Pre-Procedure Instructions (Signed)
Your procedure is scheduled on Wed., Jan. 27, 2021 from 10:00AM-12:55PM  Report to [facility] Entrance "A" at 8:00AM  Call this number if you have problems the morning of surgery:  [phone]  Remember:   Do not eat or drink after midnight on Jan. 26th    Take these medicines the morning of surgery with A SIP OF WATER: If Needed: Acetaminophen (TYLENOL)   As of today, stop taking all Aspirin (unless instructed by your doctor) and Other Aspirin containing products, Vitamins, Fish oils, and Herbal medications. Also stop all NSAIDS i.e. Advil, Ibuprofen, Motrin, Aleve, Anaprox, Naproxen, BC, Goody Powders, and all Supplements.  No Smoking of any kind, Tobacco, or Alcohol products 24 hours prior to your procedure.   Special instructions:   [facility]- Preparing For Surgery  Before surgery, you can play an important role. Because skin is not sterile, your skin needs to be as free of germs as possible. You can reduce the number of germs on your skin by washing with CHG (chlorahexidine gluconate) Soap before surgery.  CHG is an antiseptic cleaner which kills germs and bonds with the skin to continue killing germs even after washing.    Please do not use if you have an allergy to CHG or antibacterial soaps. If your skin becomes reddened/irritated stop using the CHG.  Do not shave (including legs and underarms) for at least 48 hours prior to first CHG shower. It is OK to shave your face.  Please follow these instructions carefully.   1. Shower the NIGHT BEFORE SURGERY and the MORNING OF SURGERY with CHG.   2. If you chose to wash your hair, wash your hair first as usual with your normal shampoo.  3. After you shampoo, wash your face and private area with the soap you use at home, then rinse your hair and body thoroughly to remove the shampoo and soap.  4. Use CHG as you would any other liquid soap. You can apply CHG directly to the skin and wash gently with a scrungie or a clean  washcloth.   5. Apply the CHG Soap to your body ONLY FROM THE NECK DOWN.  Do not use on open wounds or open sores. Avoid contact with your eyes, ears, mouth and genitals (private parts).   6. Wash thoroughly, paying special attention to the area where your surgery will be performed.  7. Thoroughly rinse your body with warm water from the neck down.  8. DO NOT shower/wash with your normal soap after using and rinsing off the CHG Soap.  9. Pat yourself dry with a CLEAN TOWEL.  10. Wear CLEAN PAJAMAS to bed the night before surgery, wear comfortable clothes the morning of surgery  11. Place CLEAN SHEETS on your bed the night of your first shower and DO NOT SLEEP WITH PETS.  Day of Surgery Shower as instructed above            Remember to brush your teeth WITH YOUR REGULAR TOOTHPASTE.  Do not wear jewelry.  Do not wear lotions, powders, colognes, or deodorant.  Do not shave 48 hours prior to surgery.  Men may shave face and neck.  Do not bring valuables to the hospital.  [facility] is not responsible for any belongings or valuables.  Contacts, dentures or bridgework may not be worn into surgery.    For patients admitted to the hospital, discharge time will be determined by your treatment team.  Patients discharged the day of surgery will  not be allowed to drive home, and someone age 36 and over needs to stay with them for 24 hours.  Please wear clean clothes to the hospital/surgery center.    Please read over the following fact sheets that you were given.

## 2019-09-16 ENCOUNTER — Ambulatory Visit (HOSPITAL_COMMUNITY): Payer: Self-pay | Admitting: Certified Registered"

## 2019-09-16 ENCOUNTER — Encounter (HOSPITAL_COMMUNITY): Payer: Self-pay

## 2019-09-16 ENCOUNTER — Encounter (HOSPITAL_COMMUNITY)
Admission: RE | Disposition: A | Discharge status: Home / Self Care | Payer: Self-pay | Source: Ambulatory Visit | Attending: Student

## 2019-09-16 ENCOUNTER — Ambulatory Visit (HOSPITAL_COMMUNITY)
Admission: RE | Admit: 2019-09-16 | Discharge: 2019-09-16 | Disposition: A | Discharge status: Home / Self Care | Payer: Self-pay | Source: Ambulatory Visit | Attending: Student | Admitting: Student

## 2019-09-16 DIAGNOSIS — X58XXXA Exposure to other specified factors, initial encounter: Secondary | ICD-10-CM | POA: Insufficient documentation

## 2019-09-16 DIAGNOSIS — F17210 Nicotine dependence, cigarettes, uncomplicated: Secondary | ICD-10-CM | POA: Insufficient documentation

## 2019-09-16 DIAGNOSIS — S02652A Fracture of angle of left mandible, initial encounter for closed fracture: Secondary | ICD-10-CM | POA: Insufficient documentation

## 2019-09-16 DIAGNOSIS — K011 Impacted teeth: Secondary | ICD-10-CM | POA: Insufficient documentation

## 2019-09-16 DIAGNOSIS — S02652B Fracture of angle of left mandible, initial encounter for open fracture: Secondary | ICD-10-CM | POA: Insufficient documentation

## 2019-09-16 HISTORY — PX: ORIF MANDIBULAR FRACTURE: SHX2127

## 2019-09-16 SURGERY — OPEN REDUCTION INTERNAL FIXATION (ORIF) MANDIBULAR FRACTURE
Anesthesia: General | Laterality: Left

## 2019-09-16 MED ORDER — PROPOFOL 10 MG/ML IV BOLUS
INTRAVENOUS | Status: AC
  Filled 2019-09-16: qty 20

## 2019-09-16 MED ORDER — DEXAMETHASONE SODIUM PHOSPHATE 10 MG/ML IJ SOLN
INTRAMUSCULAR | Status: DC | PRN
  Administered 2019-09-16: 10 mg via INTRAVENOUS

## 2019-09-16 MED ORDER — BACITRACIN ZINC 500 UNIT/GM EX OINT: TOPICAL_OINTMENT | CUTANEOUS | 0 refills | Status: DC

## 2019-09-16 MED ORDER — PROMETHAZINE HCL 25 MG/ML IJ SOLN: 6.2500 mg | INTRAMUSCULAR | Status: DC | PRN

## 2019-09-16 MED ORDER — ROCURONIUM BROMIDE 10 MG/ML (PF) SYRINGE
PREFILLED_SYRINGE | INTRAVENOUS | Status: AC
  Filled 2019-09-16: qty 10

## 2019-09-16 MED ORDER — LIDOCAINE 2% (20 MG/ML) 5 ML SYRINGE
INTRAMUSCULAR | Status: AC
  Filled 2019-09-16: qty 5

## 2019-09-16 MED ORDER — DIPHENHYDRAMINE HCL 50 MG/ML IJ SOLN
INTRAMUSCULAR | Status: AC
  Filled 2019-09-16: qty 1

## 2019-09-16 MED ORDER — CHLORHEXIDINE GLUCONATE 0.12 % MT SOLN: 15.0000 mL | Freq: Two times a day (BID) | OROMUCOSAL | 0 refills | Status: DC

## 2019-09-16 MED ORDER — HYDROCODONE-ACETAMINOPHEN 5-325 MG PO TABS: 1.0000 | ORAL_TABLET | ORAL | 0 refills | Status: DC | PRN

## 2019-09-16 MED ORDER — LIDOCAINE-EPINEPHRINE 2 %-1:100000 IJ SOLN
INTRAMUSCULAR | Status: DC | PRN
  Administered 2019-09-16: 19 mL via INTRADERMAL

## 2019-09-16 MED ORDER — OXYCODONE HCL 5 MG/5ML PO SOLN: 5.0000 mg | Freq: Once | ORAL | Status: DC | PRN

## 2019-09-16 MED ORDER — BACITRACIN ZINC 500 UNIT/GM EX OINT
TOPICAL_OINTMENT | CUTANEOUS | Status: DC | PRN
  Administered 2019-09-16: 1 via TOPICAL

## 2019-09-16 MED ORDER — CEFAZOLIN SODIUM-DEXTROSE 2-4 GM/100ML-% IV SOLN
INTRAVENOUS | Status: AC
  Filled 2019-09-16: qty 100

## 2019-09-16 MED ORDER — OXYCODONE HCL 5 MG PO TABS: 5.0000 mg | ORAL_TABLET | Freq: Once | ORAL | Status: DC | PRN

## 2019-09-16 MED ORDER — FENTANYL CITRATE (PF) 250 MCG/5ML IJ SOLN
INTRAMUSCULAR | Status: AC
  Filled 2019-09-16: qty 5

## 2019-09-16 MED ORDER — FENTANYL CITRATE (PF) 100 MCG/2ML IJ SOLN
INTRAMUSCULAR | Status: DC | PRN
  Administered 2019-09-16 (×2): 50 ug via INTRAVENOUS
  Administered 2019-09-16: 150 ug via INTRAVENOUS
  Administered 2019-09-16: 50 ug via INTRAVENOUS

## 2019-09-16 MED ORDER — MIDAZOLAM HCL 2 MG/2ML IJ SOLN
INTRAMUSCULAR | Status: AC
Start: 2019-09-16 — End: ?
  Filled 2019-09-16: qty 2

## 2019-09-16 MED ORDER — HYDROCODONE-ACETAMINOPHEN 5-325 MG PO TABS
ORAL_TABLET | ORAL | Status: AC
  Filled 2019-09-16: qty 1

## 2019-09-16 MED ORDER — HYDROCODONE-ACETAMINOPHEN 5-325 MG PO TABS
1.0000 | ORAL_TABLET | Freq: Once | ORAL | Status: AC
  Administered 2019-09-16: 1 via ORAL

## 2019-09-16 MED ORDER — FENTANYL CITRATE (PF) 100 MCG/2ML IJ SOLN
INTRAMUSCULAR | Status: AC
  Filled 2019-09-16: qty 2

## 2019-09-16 MED ORDER — FENTANYL CITRATE (PF) 100 MCG/2ML IJ SOLN
25.0000 ug | INTRAMUSCULAR | Status: DC | PRN
  Administered 2019-09-16 (×2): 25 ug via INTRAVENOUS

## 2019-09-16 MED ORDER — 0.9 % SODIUM CHLORIDE (POUR BTL) OPTIME
TOPICAL | Status: DC | PRN
  Administered 2019-09-16: 1000 mL

## 2019-09-16 MED ORDER — DEXAMETHASONE SODIUM PHOSPHATE 10 MG/ML IJ SOLN
INTRAMUSCULAR | Status: AC
  Filled 2019-09-16: qty 1

## 2019-09-16 MED ORDER — OXYMETAZOLINE HCL 0.05 % NA SOLN
NASAL | Status: AC
  Filled 2019-09-16: qty 30

## 2019-09-16 MED ORDER — OXYMETAZOLINE HCL 0.05 % NA SOLN
NASAL | Status: DC | PRN
  Administered 2019-09-16 (×2): 2 via NASAL

## 2019-09-16 MED ORDER — ROCURONIUM BROMIDE 10 MG/ML (PF) SYRINGE
PREFILLED_SYRINGE | INTRAVENOUS | Status: DC | PRN
  Administered 2019-09-16: 20 mg via INTRAVENOUS
  Administered 2019-09-16: 40 mg via INTRAVENOUS

## 2019-09-16 MED ORDER — PROPOFOL 10 MG/ML IV BOLUS
INTRAVENOUS | Status: DC | PRN
  Administered 2019-09-16: 200 mg via INTRAVENOUS

## 2019-09-16 MED ORDER — ONDANSETRON HCL 4 MG/2ML IJ SOLN
INTRAMUSCULAR | Status: DC | PRN
  Administered 2019-09-16: 4 mg via INTRAVENOUS

## 2019-09-16 MED ORDER — SUGAMMADEX SODIUM 200 MG/2ML IV SOLN
INTRAVENOUS | Status: DC | PRN
  Administered 2019-09-16: 200 mg via INTRAVENOUS

## 2019-09-16 MED ORDER — CLINDAMYCIN HCL 150 MG PO CAPS: 150.0000 mg | ORAL_CAPSULE | Freq: Four times a day (QID) | ORAL | 0 refills | Status: AC

## 2019-09-16 MED ORDER — LIDOCAINE-EPINEPHRINE 2 %-1:100000 IJ SOLN
INTRAMUSCULAR | Status: AC
  Filled 2019-09-16: qty 1

## 2019-09-16 MED ORDER — PHENYLEPHRINE 40 MCG/ML (10ML) SYRINGE FOR IV PUSH (FOR BLOOD PRESSURE SUPPORT)
PREFILLED_SYRINGE | INTRAVENOUS | Status: AC
  Filled 2019-09-16: qty 10

## 2019-09-16 MED ORDER — ONDANSETRON HCL 4 MG/2ML IJ SOLN
INTRAMUSCULAR | Status: AC
  Filled 2019-09-16: qty 2

## 2019-09-16 MED ORDER — LIDOCAINE 2% (20 MG/ML) 5 ML SYRINGE
INTRAMUSCULAR | Status: DC | PRN
  Administered 2019-09-16: 80 mg via INTRAVENOUS

## 2019-09-16 MED ORDER — SUCCINYLCHOLINE CHLORIDE 200 MG/10ML IV SOSY
PREFILLED_SYRINGE | INTRAVENOUS | Status: DC | PRN
  Administered 2019-09-16: 120 mg via INTRAVENOUS

## 2019-09-16 MED ORDER — IBUPROFEN 800 MG PO TABS: 800.0000 mg | ORAL_TABLET | Freq: Three times a day (TID) | ORAL | 0 refills | Status: DC | PRN

## 2019-09-16 MED ORDER — LIDOCAINE-EPINEPHRINE 1 %-1:100000 IJ SOLN
INTRAMUSCULAR | Status: AC
  Filled 2019-09-16: qty 1

## 2019-09-16 MED ORDER — LACTATED RINGERS IV SOLN: INTRAVENOUS | Status: DC

## 2019-09-16 MED ORDER — MIDAZOLAM HCL 5 MG/5ML IJ SOLN
INTRAMUSCULAR | Status: DC | PRN
  Administered 2019-09-16: 2 mg via INTRAVENOUS

## 2019-09-16 MED ORDER — CEFAZOLIN SODIUM-DEXTROSE 2-4 GM/100ML-% IV SOLN
2.0000 g | Freq: Once | INTRAVENOUS | Status: AC
  Administered 2019-09-16: 11:00:00 2 g via INTRAVENOUS

## 2019-09-16 MED ORDER — BACITRACIN ZINC 500 UNIT/GM EX OINT
TOPICAL_OINTMENT | CUTANEOUS | Status: AC
  Filled 2019-09-16: qty 28.35

## 2019-09-16 SURGICAL SUPPLY — 50 items
BIT DRILL RAINBOW 1.6X35 (BIT) ×3 IMPLANT
BLADE CLIPPER SURG (BLADE) IMPLANT
BLADE SURG 15 STRL LF DISP TIS (BLADE) IMPLANT
BLADE SURG 15 STRL SS (BLADE)
BUR CROSS CUT FISSURE 1.6 (BURR) ×2 IMPLANT
BUR CROSS CUT FISSURE 1.6MM (BURR) ×1
CANISTER SUCT 3000ML PPV (MISCELLANEOUS) ×3 IMPLANT
CLEANER TIP ELECTROSURG 2X2 (MISCELLANEOUS) ×3 IMPLANT
COVER SURGICAL LIGHT HANDLE (MISCELLANEOUS) ×3 IMPLANT
DECANTER SPIKE VIAL GLASS SM (MISCELLANEOUS) ×3 IMPLANT
DRAPE HALF SHEET 40X57 (DRAPES) IMPLANT
ELECT COATED BLADE 2.86 ST (ELECTRODE) ×3 IMPLANT
ELECT REM PT RETURN 9FT ADLT (ELECTROSURGICAL) ×3
ELECTRODE REM PT RTRN 9FT ADLT (ELECTROSURGICAL) ×1 IMPLANT
GAUZE PACKING FOLDED 2  STR (GAUZE/BANDAGES/DRESSINGS) ×2
GAUZE PACKING FOLDED 2 STR (GAUZE/BANDAGES/DRESSINGS) ×1 IMPLANT
GLOVE BIO SURGEON STRL SZ7.5 (GLOVE) ×3 IMPLANT
GOWN STRL REUS W/ TWL LRG LVL3 (GOWN DISPOSABLE) ×2 IMPLANT
GOWN STRL REUS W/TWL LRG LVL3 (GOWN DISPOSABLE) ×4
KIT BASIN OR (CUSTOM PROCEDURE TRAY) ×3 IMPLANT
KIT TURNOVER KIT B (KITS) ×3 IMPLANT
NEEDLE HYPO 25GX1X1/2 BEV (NEEDLE) IMPLANT
NEEDLE PRECISIONGLIDE 27X1.5 (NEEDLE) ×3 IMPLANT
NS IRRIG 1000ML POUR BTL (IV SOLUTION) ×3 IMPLANT
PAD ARMBOARD 7.5X6 YLW CONV (MISCELLANEOUS) ×6 IMPLANT
PENCIL BUTTON HOLSTER BLD 10FT (ELECTRODE) ×3 IMPLANT
PLATE 4 H FRACTURE C SHAPE (Plate) ×3 IMPLANT
POSITIONER HEAD DONUT 9IN (MISCELLANEOUS) IMPLANT
SCISSORS WIRE ANG 4 3/4 DISP (INSTRUMENTS) ×3 IMPLANT
SCREW BONE CROSS PIN 2.0X10MM (Screw) ×9 IMPLANT
SCREW BONE CROSS PIN 2.0X12MM (Screw) ×3 IMPLANT
SCREW UPPER FACE 2.0X8MM (Screw) ×18 IMPLANT
STAPLER VISISTAT (STAPLE) ×3 IMPLANT
SUT ETHILON 4 0 CL P 3 (SUTURE) IMPLANT
SUT ETHILON 5 0 PS 2 18 (SUTURE) ×3 IMPLANT
SUT MON AB 3-0 SH 27 (SUTURE) ×4
SUT MON AB 3-0 SH27 (SUTURE) ×2 IMPLANT
SUT PROLENE 6 0 PC 1 (SUTURE) IMPLANT
SUT STEEL 0 (SUTURE)
SUT STEEL 0 18XMFL TIE 17 (SUTURE) IMPLANT
SUT STEEL 1 (SUTURE) ×3 IMPLANT
SUT STEEL 2 (SUTURE) IMPLANT
SUT STEEL 4 (SUTURE) ×3 IMPLANT
SUT VIC AB 3-0 SH 27 (SUTURE) ×2
SUT VIC AB 3-0 SH 27X BRD (SUTURE) ×1 IMPLANT
SUT VICRYL 4-0 PS2 18IN ABS (SUTURE) IMPLANT
SYR CONTROL 10ML LL (SYRINGE) ×3 IMPLANT
TOWEL GREEN STERILE FF (TOWEL DISPOSABLE) ×3 IMPLANT
TRAY ENT MC OR (CUSTOM PROCEDURE TRAY) ×3 IMPLANT
WATER STERILE IRR 1000ML POUR (IV SOLUTION) ×3 IMPLANT

## 2019-09-16 NOTE — Transfer of Care (Signed)
Immediate Anesthesia Transfer of Care Note  Patient: Latravion Graves  Procedure(s) Performed: OPEN REDUCTION INTERNAL FIXATION (ORIF) left MANDIBULAR FRACTURE AND EXTRACTION OF 217 (Left )  Patient Location: PACU  Anesthesia Type:General  Level of Consciousness: oriented, drowsy and patient cooperative  Airway & Oxygen Therapy: Patient Spontanous Breathing and Patient connected to nasal cannula oxygen  Post-op Assessment: Report given to RN and Post -op Vital signs reviewed and stable  Post vital signs: Reviewed  Last Vitals:  Vitals Value Taken Time  BP 157/106 09/16/19 1317  Temp    Pulse 71 09/16/19 1320  Resp 18 09/16/19 1320  SpO2 100 % 09/16/19 1320  Vitals shown include unvalidated device data.  Last Pain:  Vitals:   09/16/19 1315  TempSrc:   PainSc: (P) Asleep         Complications: No apparent anesthesia complications

## 2019-09-16 NOTE — Discharge Instructions (Signed)
Mandibular Fracture  A mandibular fracture is a break in the jawbone.  Follow these instructions at home:  Please contact our office to schedule a follow up appointment with Dr. Julien Girt for 2 weeks from your surgery. ([facility]- phone: [phone])  You have been prescribed an antibiotic called Clindamycin. Please take this until it is completed.   You have also been prescribed an antibiotic mouth rinse. Please use this twice daily for 2 weeks.   Sutures in your mouth will dissolve on their own, sutures on your cheek will be removed at your follow up appointment.   Maintain a non-chew diet for 6 weeks after surgery (mashed potatoes, smoothie, scrambled eggs, etc..)  If told, apply ice to the injured area: ? Put ice in a plastic bag. ? Place a towel between your skin and the bag. ? Leave the ice on for 20 minutes, 2-3 times per day.  Take over-the-counter and prescription medicines only as told by your doctor.  Follow your doctor's instructions about diet. You may need to eat only soft foods or liquid foods. Make sure to get enough protein and other nutrients in your diet.  Sleep on your back. This makes it so that you do not put pressure on your jaw.  Try not to exercise so hard that you get short of breath. Contact a doctor if:  You have a bad headache.  You cannot feel parts of your face.  You have very bad pain in your jaw that does not get better when you take medicine.  You feel like you are going to throw up (nauseous) and nothing helps.  You feel anxious and nothing helps.  Your swelling or redness gets worse. Get help right away if:  You have a fever.  You have trouble breathing.  You feel like your airway is tight.  You cannot swallow your spit (saliva).  You make a high-pitched whistling sound when you breathe (wheeze). This information is not intended to replace advice given to you by your health care provider. Make sure you discuss any  questions you have with your health care provider.

## 2019-09-16 NOTE — Op Note (Signed)
OPERATIVE REPORT:    Surgeon:  Fabian Sharp MD, DDS  CRNA: Luciana Axe, CRNA  CST: Dollene Cleveland, CST Mindy Teschner, CST   Preoperative Diagnosis:  - MANDIBULAR ANGLE FRACTURE (LEFT) - IMPACTED TOOTH (#17)   Postoperative Diagnosis: same   Procedure Performed: - OPEN REDUCTION INTERNAL FIXATION (ORIF) LEFT MANDIBULAR FRACTURE   - EXTRACTION OF #17  Anesthesia:  General via nasoendotracheal intubation.   Specimens: None   Drains: None   Cultures: None   Estimated Blood Loss: 50 mL   Urine Output: N/A   Dressings: None   Complications: None   Operative Findings: Occlusion was stable and repeatable at the conclusion of the procedure.   Good anatomic reduction of fracture with stable postoperative fixation   Implants: - 4 hole curved fracture plate 2.0MM (left mandible plate) - 2.0 X 73mm bone screws X 3 - 2.0 X 30mm bone screws X 1   Indications for Surgery:  Patient is a healthy 29 year old male who presents for pain of the left mandible with findings of left mandibular angle fracture through impacted tooth #17. The patient is unaware of acute trauma and reports he has had pain for approximately 1 week. Denies symptoms of paraesthesia, but reports changes to his occlusion that he first noticed 1 week ago.    Procedure: The patient was identified in the precare holding area and medical history and informed consent were reviewed with the patient and family. All questions were answered. The patient was taken via stretcher to the operating room and placed in the supine position on the operating table. Standard lines and monitors were applied. The patient was preoxygenated and general anesthesia was induced via IV. The patient was intubated nasoendotracheally without complications. The tube was secured. Bilateral breath sounds and end tidal CO2 were noted. Eyes were taped. The bed was turned 90 degrees. The arms were tucked. Extremities with normal range  of motion and all pressure points padded. Preoperative antibiotics were administered. The patient was prepped and draped using sterile technique. A throat pack was placed and noted.A time out was performed. 10cc of 2% lidocaine with 1:100,000 epinephrine was injected into the left mandible and in areas of planned IMF screws.     Attention was then turned to the left mandible where an incision was made along the lateral border of the mandible extending to the sulcus of partially erupted tooth #17 and was then carried anteriorly into the left vestibule.  Soft tissue was reflected in a subperiosteal plane down to the inferior border.  The fracture was identified and noted to be partially healed as a malunion.  granulation tissue and callous was removed on the buccal aspect of the left mandibnle with a double-ended curette.    Next a tapered fissure bur was used to trough tooth #27 and section the tooth under copious amounts of irrication. The tooth was removed in it's entirety and the site was curetted removing all granulation tissue. The nerve was not encountered. Next a spatula osteotome and mallet were used to recreate the fracture of the left buccal cortex. Flexion was confirmed along the buccal and lingual cortex.    Next 8 mm Karlis screws was placed in the bilateral posterior mandible and maxilla (4 total maxillary and 2 total mandibular in bilateral triangular fashion).  Using 24 gauge wire the maxilla and mandible were brought into maxillomandibular fixation.  Stable occlusion noted bilaterally, consistent with pretraumatic occlusion.    Attention was then turned to the left posterior  mandible again. Hypodermic needle, 15 blade and then hemostats were used to make a path for the trocar.  The trocar was then placed through the left cheek.  A 4 hole 2.68mm plate was then bent to the lateral aspect of the left posterior mandible spanning the fracture. The holes were predrilled under irrigation and 4 screws  were placed.  There was excellent reduction of the fracture and the occlusion was still stable.   The patient was then cut out of maxillomandibular fixation.  The IMF screws were removed. The occlusion was found to be stable and repeatable.  The surgical site was then irrigated with saline.  The incision was then closed using simple interrupted and continuous 3-0 vicryl sutures. A 5-0 Nylon suture was then used to re-approximate the left cheek incision.    The oral cavity and oropharynx were irrigated with copious amounts of normal saline and the oropharynx was suctioned. The throat pack was removed under direct visualization and the oropharynx was suctioned and nasogastric tube was placed and the stomach contents were removed. The orogastric tube was then removed. Prep and drapes were removed. The patient was cleansed and dried and turned back to the Anesthesia Care team where he was awakened without event and transferred to the PACU by the Anesthesia Care team for postoperative recovery.

## 2019-09-16 NOTE — Anesthesia Postprocedure Evaluation (Signed)
Anesthesia Post Note  Patient: Ryan Cooper  Procedure(s) Performed: OPEN REDUCTION INTERNAL FIXATION (ORIF) left MANDIBULAR FRACTURE AND EXTRACTION OF 217 (Left )     Patient location during evaluation: PACU Anesthesia Type: General Level of consciousness: awake and alert Pain management: pain level controlled Vital Signs Assessment: post-procedure vital signs reviewed and stable Respiratory status: spontaneous breathing, nonlabored ventilation and respiratory function stable Cardiovascular status: blood pressure returned to baseline and stable Postop Assessment: no apparent nausea or vomiting Anesthetic complications: no    Last Vitals:  Vitals:   09/16/19 1345 09/16/19 1400  BP: (!) 170/107 (!) 156/99  Pulse: 66 65  Resp: 17 14  Temp:    SpO2: 97% 97%                   Beryle Lathe

## 2019-09-16 NOTE — Anesthesia Preprocedure Evaluation (Addendum)
Anesthesia Evaluation  Patient identified by MRN, date of birth, ID band Patient awake    Reviewed: Allergy & Precautions, NPO status , Patient's Chart, lab work & pertinent test results  History of Anesthesia Complications Negative for: history of anesthetic complications  Airway Mallampati: III  TM Distance: >3 FB Neck ROM: Full    Dental  (+) Dental Advisory Given, Chipped,    Pulmonary Current Smoker and Patient abstained from smoking.,    Pulmonary exam normal        Cardiovascular negative cardio ROS Normal cardiovascular exam     Neuro/Psych negative neurological ROS  negative psych ROS   GI/Hepatic negative GI ROS, (+)     substance abuse  marijuana use,   Endo/Other  negative endocrine ROS  Renal/GU negative Renal ROS     Musculoskeletal negative musculoskeletal ROS (+)   Abdominal   Peds  Hematology negative hematology ROS (+)   Anesthesia Other Findings Covid neg 1/26   Reproductive/Obstetrics                            Anesthesia Physical Anesthesia Plan  ASA: II  Anesthesia Plan: General   Post-op Pain Management:    Induction: Intravenous  PONV Risk Score and Plan: 2 and Treatment may vary due to age or medical condition, Ondansetron, Dexamethasone and Midazolam  Airway Management Planned: Nasal ETT  Additional Equipment: None  Intra-op Plan:   Post-operative Plan: Extubation in OR  Informed Consent: I have reviewed the patients History and Physical, chart, labs and discussed the procedure including the risks, benefits and alternatives for the proposed anesthesia with the patient or authorized representative who has indicated his/her understanding and acceptance.     Dental advisory given  Plan Discussed with: CRNA and Anesthesiologist  Anesthesia Plan Comments:        Anesthesia Quick Evaluation

## 2019-09-16 NOTE — H&P (Signed)
Ryan Cooper  has presented today for surgery, with the diagnosis of LEFT MANDIBLE FRACTURE.  The various methods of treatment have been discussed with the patient and family. After consideration of risks, benefits and other options for treatment, the patient has consented to  Procedure(s): OPEN REDUCTION INTERNAL FIXATION (ORIF) MANDIBULAR FRACTURE POSSIBLE TEETH EXTRACTION (LEFT) as a surgical intervention.  The patient's history has been reviewed, patient examined, no change in status, stable for surgery.  I have reviewed the patient's chart and labs.  Questions were answered to the patient's satisfaction. Patient was marked in the pre-op holding area and all questions answered.

## 2019-09-16 NOTE — Anesthesia Procedure Notes (Signed)
Procedure Name: Intubation Date/Time: 09/16/2019 10:39 AM Performed by: Lovie Chol, CRNA Pre-anesthesia Checklist: Patient identified, Emergency Drugs available, Suction available and Patient being monitored Patient Re-evaluated:Patient Re-evaluated prior to induction Oxygen Delivery Method: Circle System Utilized Preoxygenation: Pre-oxygenation with 100% oxygen Induction Type: IV induction Ventilation: Mask ventilation without difficulty Laryngoscope Size: Glidescope and 4 Grade View: Grade I Nasal Tubes: Left, Nasal prep performed and Nasal Rae Tube size: 7.5 mm Number of attempts: 1 Airway Equipment and Method: Video-laryngoscopy Placement Confirmation: ETT inserted through vocal cords under direct vision,  positive ETCO2 and breath sounds checked- equal and bilateral Secured at: 29 cm Tube secured with: Tape Dental Injury: Teeth and Oropharynx as per pre-operative assessment

## 2019-09-17 ENCOUNTER — Encounter: Payer: Self-pay | Admitting: *Deleted

## 2020-01-23 ENCOUNTER — Encounter (HOSPITAL_COMMUNITY): Payer: Self-pay | Admitting: Emergency Medicine

## 2020-01-23 ENCOUNTER — Emergency Department (HOSPITAL_COMMUNITY)
Admission: EM | Admit: 2020-01-23 | Discharge: 2020-01-23 | Disposition: A | Discharge status: Home / Self Care | Attending: Emergency Medicine | Admitting: Emergency Medicine

## 2020-01-23 ENCOUNTER — Emergency Department (HOSPITAL_COMMUNITY)

## 2020-01-23 ENCOUNTER — Other Ambulatory Visit: Payer: Self-pay

## 2020-01-23 DIAGNOSIS — S51011A Laceration without foreign body of right elbow, initial encounter: Secondary | ICD-10-CM | POA: Diagnosis present

## 2020-01-23 DIAGNOSIS — F17210 Nicotine dependence, cigarettes, uncomplicated: Secondary | ICD-10-CM | POA: Diagnosis not present

## 2020-01-23 DIAGNOSIS — F1210 Cannabis abuse, uncomplicated: Secondary | ICD-10-CM | POA: Insufficient documentation

## 2020-01-23 DIAGNOSIS — Y939 Activity, unspecified: Secondary | ICD-10-CM | POA: Diagnosis not present

## 2020-01-23 DIAGNOSIS — S41111A Laceration without foreign body of right upper arm, initial encounter: Secondary | ICD-10-CM

## 2020-01-23 DIAGNOSIS — Y929 Unspecified place or not applicable: Secondary | ICD-10-CM | POA: Diagnosis not present

## 2020-01-23 DIAGNOSIS — W268XXA Contact with other sharp object(s), not elsewhere classified, initial encounter: Secondary | ICD-10-CM | POA: Insufficient documentation

## 2020-01-23 DIAGNOSIS — M25531 Pain in right wrist: Secondary | ICD-10-CM | POA: Insufficient documentation

## 2020-01-23 DIAGNOSIS — Y999 Unspecified external cause status: Secondary | ICD-10-CM | POA: Insufficient documentation

## 2020-01-23 MED ORDER — LIDOCAINE-EPINEPHRINE (PF) 2 %-1:200000 IJ SOLN
10.0000 mL | Freq: Once | INTRAMUSCULAR | Status: AC
  Administered 2020-01-23: 10 mL
  Filled 2020-01-23: qty 20

## 2020-01-23 NOTE — Discharge Instructions (Signed)
The sutures will absorb on their own. Keep the area clean and use antibiotic ointment such as Neosporin. Return to the ED for signs of infection including redness, swelling, trouble moving the joints or fever.

## 2020-01-23 NOTE — ED Provider Notes (Signed)
[place] DEPT Provider Note   CSN: [id number] Arrival date & time: 01/23/20  0142     History Chief Complaint  Patient presents with  . Laceration    Ryan Cooper is a 29 y.o. male with no significant past medical history who presents to the ED with a chief complaint of a laceration.  Earlier today sustained cuts to his right [facility] area from a fence.  States that his right arm got caught in the ends he reports pain in his right wrist as well.  He denies any head injuries or changes to gait.  He states that last tetanus shot was within the past 5 years.  Denies any anticoagulant use.  HPI     Past Medical History:  Diagnosis Date  . Medical history non-contributory     There are no problems to display for this patient.   Past Surgical History:  Procedure Laterality Date  . NO PAST SURGERIES    . ORIF MANDIBULAR FRACTURE Left 09/16/2019   Procedure: OPEN REDUCTION INTERNAL FIXATION (ORIF) left MANDIBULAR FRACTURE AND EXTRACTION OF 217;  Surgeon: Ronal Fear, MD;  Location: [place];  Service: Plastics;  Laterality: Left;  NASAL INTUBATION       History reviewed. No pertinent family history.  Social History   Tobacco Use  . Smoking status: Current Every Day Smoker    Packs/day: 0.50    Years: 10.00    Pack years: 5.00  . Smokeless tobacco: Never Used  Substance Use Topics  . Alcohol use: No  . Drug use: Yes    Frequency: 1.0 times per week    Types: Marijuana    Home Medications Prior to Admission medications   Medication Sig Start Date End Date Taking? Authorizing Provider  acetaminophen (TYLENOL) 325 MG tablet Take 650 mg by mouth every 6 (six) hours as needed (for pain.).    [provider]  bacitracin ointment Apply to affected area daily (left cheek) 09/16/19 09/15/20  Ronal Fear, MD  chlorhexidine (PERIDEX) 0.12 % solution Use as directed 15 mLs in the mouth or throat 2 (two) times daily. 09/16/19    Ronal Fear, MD  HYDROcodone-acetaminophen (NORCO/VICODIN) 5-325 MG tablet Take 1 tablet by mouth every 4 (four) hours as needed for moderate pain. 09/16/19 09/15/20  Ronal Fear, MD  ibuprofen (ADVIL) 800 MG tablet Take 1 tablet (800 mg total) by mouth every 8 (eight) hours as needed. 09/16/19   Ronal Fear, MD    Allergies    Patient has no known allergies.  Review of Systems   Review of Systems  Constitutional: Negative for chills and fever.  Musculoskeletal: Positive for arthralgias.  Skin: Positive for wound.    Physical Exam Updated Vital Signs BP (!) 137/96   Pulse 70   Temp 98.5 F (36.9 C) (Oral)   Resp 16   Ht 6' (1.829 m)   Wt 63.5 kg   SpO2 98%   BMI 18.99 kg/m   Physical Exam Vitals and nursing note reviewed.  Constitutional:      General: He is not in acute distress.    Appearance: He is well-developed. He is not diaphoretic.  HENT:     Head: Normocephalic and atraumatic.  Eyes:     General: No scleral icterus.    Conjunctiva/sclera: Conjunctivae normal.  Pulmonary:     Effort: Pulmonary effort is normal. No respiratory distress.  Musculoskeletal:        General: Tenderness present.  Arms:     Cervical back: Normal range of motion.     Comments: Palpation of the right wrist as indicated area without changes to range of motion.  No changes to range of motion right elbow.  2+ radial pulse palpated bilaterally.  Skin:    Findings: Laceration and wound present. No rash.     Comments: Several superficial linear abrasions noted to right AC joint.  There appears to be 1, 1 cm slightly deeper laceration in the area with active bleeding. Superficial abrasions noted to bilateral ankles.  Neurological:     Mental Status: He is alert.     ED Results / Procedures / Treatments   Labs (all labs ordered are listed, but only abnormal results are displayed) Labs Reviewed - No data to display  EKG None  Radiology DG  Elbow Complete Right  Result Date: 01/23/2020 CLINICAL DATA:  Initial evaluation for acute trauma, lacerations. EXAM: RIGHT ELBOW - COMPLETE 3+ VIEW COMPARISON:  None. FINDINGS: There is no evidence of fracture, dislocation, or joint effusion. There is no evidence of arthropathy or other focal bone abnormality. No visible soft tissue injury. No radiopaque foreign body. IMPRESSION: Negative radiographs of the right elbow. No acute osseous abnormality. Electronically Signed   By: Rise Mu M.D.   On: 01/23/2020 02:36   DG Wrist Complete Right  Result Date: 01/23/2020 CLINICAL DATA:  Initial evaluation for acute trauma, laceration. EXAM: RIGHT WRIST - COMPLETE 3+ VIEW COMPARISON:  None. FINDINGS: No acute fracture dislocation. Radiocarpal and distal renal ulnar articulations maintained. Punctate calcific density noted adjacent to the hamate, nonspecific, but felt to be consistent with a chronic finding. No visible soft tissue injury. No radiopaque foreign body. IMPRESSION: No acute osseous abnormality about the right wrist. Electronically Signed   By: Rise Mu M.D.   On: 01/23/2020 02:39    Procedures .Marland KitchenLaceration Repair  Date/Time: 01/23/2020 3:19 AM Performed by: Dietrich Pates, PA-C Authorized by: Dietrich Pates, PA-C   Consent:    Consent obtained:  Verbal   Consent given by:  Patient   Risks discussed:  Infection, need for additional repair, nerve damage, pain, poor cosmetic result, poor wound healing and retained foreign body   Alternatives discussed:  No treatment Anesthesia (see MAR for exact dosages):    Anesthesia method:  Local infiltration   Local anesthetic:  Lidocaine 2% WITH epi Laceration details:    Location:  Shoulder/arm   Shoulder/arm location:  R elbow   Length (cm):  2 Repair type:    Repair type:  Simple Pre-procedure details:    Preparation:  Patient was prepped and draped in usual sterile fashion Exploration:    Hemostasis achieved with:  Direct  pressure   Wound exploration: wound explored through full range of motion   Treatment:    Area cleansed with:  Saline   Amount of cleaning:  Extensive   Irrigation solution:  Sterile saline   Irrigation method:  Syringe and pressure wash Skin repair:    Repair method:  Sutures   Suture size:  4-0   Wound skin closure material used: vicryl rapide.   Suture technique:  Simple interrupted   Number of sutures:  2 Approximation:    Approximation:  Close Post-procedure details:    Dressing:  Open (no dressing)   Patient tolerance of procedure:  Tolerated well, no immediate complications   (including critical care time)  Medications Ordered in ED Medications  lidocaine-EPINEPHrine (XYLOCAINE W/EPI) 2 %-1:200000 (PF) injection 10 mL (10 mLs  Infiltration Given by Other 01/23/20 5956)    ED Course  I have reviewed the triage vital signs and the nursing notes.  Pertinent labs & imaging results that were available during my care of the patient were reviewed by me and considered in my medical decision making (see chart for details).    MDM Rules/Calculators/A&P                      Patient counseled on wound care.  X-rays here are negative for acute abnormality, no foreign bodies or changes to range of motion of the joint. Patient was urged to return to the Emergency Department urgently with worsening pain, swelling, expanding erythema especially if it streaks away from the affected area, fever, or if they have any other concerns. Patient verbalized understanding.   All imaging, if done today, including plain films, CT scans, and ultrasounds, independently reviewed by me, and interpretations confirmed via formal radiology reads.  Patient is hemodynamically stable, in NAD, and able to ambulate in the ED. Evaluation does not show pathology that would require ongoing emergent intervention or inpatient treatment. I explained the diagnosis to the patient. Pain has been managed and has no  complaints prior to discharge. Patient is comfortable with above plan and is stable for discharge at this time. All questions were answered prior to disposition. Strict return precautions for returning to the ED were discussed. Encouraged follow up with PCP.   An After Visit Summary was printed and given to the patient.   Portions of this note were generated with Scientist, clinical (histocompatibility and immunogenetics). Dictation errors may occur despite best attempts at proofreading.  Final Clinical Impression(s) / ED Diagnoses Final diagnoses:  Laceration of right upper extremity, initial encounter    Rx / DC Orders ED Discharge Orders    None       Dietrich Pates, PA-C 01/23/20 0320    Ward, Layla Maw, DO 01/23/20 [phone]

## 2020-01-23 NOTE — ED Triage Notes (Signed)
[facility]. Pt has several shallow lacerations to his right arm from a fence.

## 2020-04-04 ENCOUNTER — Other Ambulatory Visit: Payer: Self-pay

## 2020-04-04 ENCOUNTER — Inpatient Hospital Stay (HOSPITAL_COMMUNITY)
Admission: EM | Admit: 2020-04-04 | Discharge: 2020-04-06 | DRG: 516 | Disposition: A | Discharge status: Home / Self Care | Payer: Self-pay | Attending: Orthopedic Surgery | Admitting: Orthopedic Surgery

## 2020-04-04 ENCOUNTER — Emergency Department (HOSPITAL_COMMUNITY): Payer: Self-pay

## 2020-04-04 DIAGNOSIS — S92811A Other fracture of right foot, initial encounter for closed fracture: Secondary | ICD-10-CM

## 2020-04-04 DIAGNOSIS — S332XXA Dislocation of sacroiliac and sacrococcygeal joint, initial encounter: Secondary | ICD-10-CM | POA: Diagnosis present

## 2020-04-04 DIAGNOSIS — E876 Hypokalemia: Secondary | ICD-10-CM | POA: Diagnosis present

## 2020-04-04 DIAGNOSIS — F17210 Nicotine dependence, cigarettes, uncomplicated: Secondary | ICD-10-CM | POA: Diagnosis present

## 2020-04-04 DIAGNOSIS — S329XXA Fracture of unspecified parts of lumbosacral spine and pelvis, initial encounter for closed fracture: Secondary | ICD-10-CM | POA: Diagnosis present

## 2020-04-04 DIAGNOSIS — T1490XA Injury, unspecified, initial encounter: Secondary | ICD-10-CM

## 2020-04-04 DIAGNOSIS — S0101XA Laceration without foreign body of scalp, initial encounter: Secondary | ICD-10-CM | POA: Diagnosis present

## 2020-04-04 DIAGNOSIS — D62 Acute posthemorrhagic anemia: Secondary | ICD-10-CM | POA: Diagnosis not present

## 2020-04-04 DIAGNOSIS — Z419 Encounter for procedure for purposes other than remedying health state, unspecified: Secondary | ICD-10-CM

## 2020-04-04 DIAGNOSIS — S92411A Displaced fracture of proximal phalanx of right great toe, initial encounter for closed fracture: Secondary | ICD-10-CM | POA: Diagnosis present

## 2020-04-04 DIAGNOSIS — S32810A Multiple fractures of pelvis with stable disruption of pelvic ring, initial encounter for closed fracture: Secondary | ICD-10-CM

## 2020-04-04 DIAGNOSIS — S32591A Other specified fracture of right pubis, initial encounter for closed fracture: Principal | ICD-10-CM | POA: Diagnosis present

## 2020-04-04 DIAGNOSIS — Z20822 Contact with and (suspected) exposure to covid-19: Secondary | ICD-10-CM | POA: Diagnosis present

## 2020-04-04 DIAGNOSIS — E559 Vitamin D deficiency, unspecified: Secondary | ICD-10-CM | POA: Diagnosis present

## 2020-04-04 DIAGNOSIS — S0181XA Laceration without foreign body of other part of head, initial encounter: Secondary | ICD-10-CM | POA: Diagnosis present

## 2020-04-04 DIAGNOSIS — R739 Hyperglycemia, unspecified: Secondary | ICD-10-CM | POA: Diagnosis present

## 2020-04-04 DIAGNOSIS — Z23 Encounter for immunization: Secondary | ICD-10-CM

## 2020-04-04 DIAGNOSIS — E8889 Other specified metabolic disorders: Secondary | ICD-10-CM | POA: Diagnosis present

## 2020-04-04 LAB — PROTIME-INR
INR: 1.1 (ref 0.8–1.2)
Prothrombin Time: 13.8 seconds (ref 11.4–15.2)

## 2020-04-04 LAB — CBC
HCT: 41 % (ref 39.0–52.0)
Hemoglobin: 13.9 g/dL (ref 13.0–17.0)
MCH: 31.1 pg (ref 26.0–34.0)
MCHC: 33.9 g/dL (ref 30.0–36.0)
MCV: 91.7 fL (ref 80.0–100.0)
Platelets: 200 10*3/uL (ref 150–400)
RBC: 4.47 MIL/uL (ref 4.22–5.81)
RDW: 13.5 % (ref 11.5–15.5)
WBC: 23.8 10*3/uL — ABNORMAL HIGH (ref 4.0–10.5)
nRBC: 0 % (ref 0.0–0.2)

## 2020-04-04 LAB — COMPREHENSIVE METABOLIC PANEL
ALT: 31 U/L (ref 0–44)
AST: 55 U/L — ABNORMAL HIGH (ref 15–41)
Albumin: 3.9 g/dL (ref 3.5–5.0)
Alkaline Phosphatase: 82 U/L (ref 38–126)
Anion gap: 10 (ref 5–15)
BUN: 14 mg/dL (ref 6–20)
CO2: 24 mmol/L (ref 22–32)
Calcium: 9.3 mg/dL (ref 8.9–10.3)
Chloride: 103 mmol/L (ref 98–111)
Creatinine, Ser: 1.07 mg/dL (ref 0.61–1.24)
GFR calc Af Amer: >60 mL/min (ref >60)
GFR calc non Af Amer: >60 mL/min (ref >60)
Glucose, Bld: 175 mg/dL — ABNORMAL HIGH (ref 70–99)
Potassium: 3.3 mmol/L — ABNORMAL LOW (ref 3.5–5.1)
Sodium: 137 mmol/L (ref 135–145)
Total Bilirubin: 0.7 mg/dL (ref 0.3–1.2)
Total Protein: 6.7 g/dL (ref 6.5–8.1)

## 2020-04-04 LAB — I-STAT CHEM 8, ED
BUN: 16 mg/dL (ref 6–20)
Calcium, Ion: 1.11 mmol/L — ABNORMAL LOW (ref 1.15–1.40)
Chloride: 103 mmol/L (ref 98–111)
Creatinine, Ser: 1 mg/dL (ref 0.61–1.24)
Glucose, Bld: 172 mg/dL — ABNORMAL HIGH (ref 70–99)
HCT: 42 % (ref 39.0–52.0)
Hemoglobin: 14.3 g/dL (ref 13.0–17.0)
Potassium: 3.3 mmol/L — ABNORMAL LOW (ref 3.5–5.1)
Sodium: 139 mmol/L (ref 135–145)
TCO2: 23 mmol/L (ref 22–32)

## 2020-04-04 LAB — ETHANOL: Alcohol, Ethyl (B): <10 mg/dL (ref <10)

## 2020-04-04 LAB — RAPID URINE DRUG SCREEN, HOSP PERFORMED
Amphetamines: NOT DETECTED
Barbiturates: NOT DETECTED
Benzodiazepines: NOT DETECTED
Cocaine: NOT DETECTED
Opiates: POSITIVE — AB
Tetrahydrocannabinol: POSITIVE — AB

## 2020-04-04 LAB — URINALYSIS, ROUTINE W REFLEX MICROSCOPIC
Bilirubin Urine: NEGATIVE
Glucose, UA: NEGATIVE mg/dL
Hgb urine dipstick: NEGATIVE
Ketones, ur: NEGATIVE mg/dL
Leukocytes,Ua: NEGATIVE
Nitrite: NEGATIVE
Protein, ur: NEGATIVE mg/dL
Specific Gravity, Urine: 1.035 — ABNORMAL HIGH (ref 1.005–1.030)
pH: 7 (ref 5.0–8.0)

## 2020-04-04 LAB — SAMPLE TO BLOOD BANK

## 2020-04-04 LAB — SARS CORONAVIRUS 2 BY RT PCR (HOSPITAL ORDER, PERFORMED IN ~~LOC~~ HOSPITAL LAB): SARS Coronavirus 2: NEGATIVE

## 2020-04-04 LAB — LACTIC ACID, PLASMA: Lactic Acid, Venous: 1.8 mmol/L (ref 0.5–1.9)

## 2020-04-04 MED ORDER — MORPHINE SULFATE (PF) 4 MG/ML IV SOLN
4.0000 mg | Freq: Once | INTRAVENOUS | Status: AC
  Administered 2020-04-04: 4 mg via INTRAVENOUS
  Filled 2020-04-04: qty 1

## 2020-04-04 MED ORDER — MORPHINE SULFATE (PF) 2 MG/ML IV SOLN
2.0000 mg | Freq: Once | INTRAVENOUS | Status: AC
  Administered 2020-04-05: 2 mg via INTRAVENOUS
  Filled 2020-04-04: qty 1

## 2020-04-04 MED ORDER — NICOTINE 7 MG/24HR TD PT24
7.0000 mg | MEDICATED_PATCH | Freq: Once | TRANSDERMAL | Status: DC
  Administered 2020-04-05: 7 mg via TRANSDERMAL
  Filled 2020-04-04: qty 1

## 2020-04-04 MED ORDER — LIDOCAINE-EPINEPHRINE-TETRACAINE (LET) TOPICAL GEL
3.0000 mL | Freq: Once | TOPICAL | Status: AC
  Administered 2020-04-04: 3 mL via TOPICAL
  Filled 2020-04-04: qty 3

## 2020-04-04 MED ORDER — ONDANSETRON 4 MG PO TBDP: 4.0000 mg | ORAL_TABLET | Freq: Four times a day (QID) | ORAL | Status: DC | PRN

## 2020-04-04 MED ORDER — ACETAMINOPHEN 325 MG PO TABS: 650.0000 mg | ORAL_TABLET | ORAL | Status: DC | PRN

## 2020-04-04 MED ORDER — OXYCODONE HCL 5 MG PO TABS: 5.0000 mg | ORAL_TABLET | ORAL | Status: DC | PRN

## 2020-04-04 MED ORDER — ONDANSETRON HCL 4 MG/2ML IJ SOLN
4.0000 mg | Freq: Four times a day (QID) | INTRAMUSCULAR | Status: DC | PRN
  Filled 2020-04-04: qty 2

## 2020-04-04 MED ORDER — MORPHINE SULFATE (PF) 2 MG/ML IV SOLN
2.0000 mg | Freq: Once | INTRAVENOUS | Status: AC
  Administered 2020-04-04: 2 mg via INTRAVENOUS
  Filled 2020-04-04: qty 1

## 2020-04-04 MED ORDER — SODIUM CHLORIDE 0.9 % IV BOLUS
1000.0000 mL | Freq: Once | INTRAVENOUS | Status: AC
  Administered 2020-04-04: 1000 mL via INTRAVENOUS

## 2020-04-04 MED ORDER — IOHEXOL 300 MG/ML  SOLN
100.0000 mL | Freq: Once | INTRAMUSCULAR | Status: AC | PRN
  Administered 2020-04-04: 100 mL via INTRAVENOUS

## 2020-04-04 MED ORDER — ONDANSETRON HCL 4 MG/2ML IJ SOLN
4.0000 mg | Freq: Once | INTRAMUSCULAR | Status: AC
  Administered 2020-04-04: 4 mg via INTRAVENOUS
  Filled 2020-04-04: qty 2

## 2020-04-04 MED ORDER — SODIUM CHLORIDE 0.9 % IV SOLN: INTRAVENOUS | Status: DC

## 2020-04-04 MED ORDER — TETANUS-DIPHTH-ACELL PERTUSSIS 5-2.5-18.5 LF-MCG/0.5 IM SUSP
0.5000 mL | Freq: Once | INTRAMUSCULAR | Status: AC
  Administered 2020-04-04: 0.5 mL via INTRAMUSCULAR
  Filled 2020-04-04: qty 0.5

## 2020-04-04 MED ORDER — OXYCODONE HCL 5 MG PO TABS
10.0000 mg | ORAL_TABLET | ORAL | Status: DC | PRN
  Administered 2020-04-04 – 2020-04-05 (×2): 10 mg via ORAL
  Filled 2020-04-04 (×2): qty 2

## 2020-04-04 MED ORDER — LIDOCAINE HCL (PF) 1 % IJ SOLN
2.0000 mL | Freq: Once | INTRAMUSCULAR | Status: AC
  Administered 2020-04-04: 2 mL

## 2020-04-04 MED ORDER — HYDROMORPHONE HCL 1 MG/ML IJ SOLN
1.0000 mg | INTRAMUSCULAR | Status: DC | PRN
  Administered 2020-04-05: 1 mg via INTRAVENOUS
  Filled 2020-04-04: qty 1

## 2020-04-04 MED ORDER — POTASSIUM CHLORIDE 10 MEQ/100ML IV SOLN
10.0000 meq | INTRAVENOUS | Status: AC
  Administered 2020-04-04 – 2020-04-05 (×3): 10 meq via INTRAVENOUS
  Filled 2020-04-04 (×3): qty 100

## 2020-04-04 NOTE — ED Notes (Addendum)
Pt's mother very upset over the visiting policy due to COVID.  GF is back w/ PT and has been calling and keeping everybody informed.  RN was in the room earlier in the visit and suggested they inform her of the visiting policy to keep her from driving to the hospital due to the distance.    RN encouraged GF and PT to face time to update her so that she would not worry.  They did and requested RN speak to her.  RN again explained the situation, also updated her to the situation and pt's current medical condition.  Provider also walked into the room and again updated everybody to the situation.
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
## 2020-04-04 NOTE — ED Notes (Signed)
xrays completed.  RN, Tec and provider bedside.
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
## 2020-04-04 NOTE — ED Notes (Signed)
Pt to CT
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
## 2020-04-04 NOTE — Progress Notes (Signed)
   04/04/20 2000  Clinical Encounter Type  Visited With Patient not available  Referral From Nurse  Consult/Referral To Chaplain  Patient not available. Laker Thompson S Sheilah Rayos, Chaplain  

## 2020-04-04 NOTE — ED Notes (Signed)
Portable Xray at bedside.

## 2020-04-05 ENCOUNTER — Inpatient Hospital Stay (HOSPITAL_COMMUNITY): Payer: Self-pay | Admitting: Certified Registered Nurse Anesthetist

## 2020-04-05 ENCOUNTER — Encounter (HOSPITAL_COMMUNITY): Admission: EM | Disposition: A | Discharge status: Home / Self Care | Payer: Self-pay | Source: Home / Self Care

## 2020-04-05 ENCOUNTER — Encounter (HOSPITAL_COMMUNITY): Payer: Self-pay

## 2020-04-05 ENCOUNTER — Inpatient Hospital Stay (HOSPITAL_COMMUNITY): Payer: Self-pay

## 2020-04-05 ENCOUNTER — Other Ambulatory Visit: Payer: Self-pay

## 2020-04-05 HISTORY — PX: ORIF PELVIC FRACTURE WITH PERCUTANEOUS SCREWS: SHX6800

## 2020-04-05 LAB — CBC
HCT: 37.9 % — ABNORMAL LOW (ref 39.0–52.0)
HCT: 38.6 % — ABNORMAL LOW (ref 39.0–52.0)
HCT: 37 % — ABNORMAL LOW (ref 39.0–52.0)
HCT: 35.3 % — ABNORMAL LOW (ref 39.0–52.0)
Hemoglobin: 12.8 g/dL — ABNORMAL LOW (ref 13.0–17.0)
Hemoglobin: 12.9 g/dL — ABNORMAL LOW (ref 13.0–17.0)
Hemoglobin: 12.2 g/dL — ABNORMAL LOW (ref 13.0–17.0)
Hemoglobin: 11.8 g/dL — ABNORMAL LOW (ref 13.0–17.0)
MCH: 30.9 pg (ref 26.0–34.0)
MCH: 31.1 pg (ref 26.0–34.0)
MCH: 30.1 pg (ref 26.0–34.0)
MCH: 30.6 pg (ref 26.0–34.0)
MCHC: 33.4 g/dL (ref 30.0–36.0)
MCHC: 33.8 g/dL (ref 30.0–36.0)
MCHC: 33 g/dL (ref 30.0–36.0)
MCHC: 33.4 g/dL (ref 30.0–36.0)
MCV: 92 fL (ref 80.0–100.0)
MCV: 92.3 fL (ref 80.0–100.0)
MCV: 91.4 fL (ref 80.0–100.0)
MCV: 91.5 fL (ref 80.0–100.0)
Platelets: 155 10*3/uL (ref 150–400)
Platelets: 157 10*3/uL (ref 150–400)
Platelets: 132 10*3/uL — ABNORMAL LOW (ref 150–400)
Platelets: 135 10*3/uL — ABNORMAL LOW (ref 150–400)
RBC: 4.12 MIL/uL — ABNORMAL LOW (ref 4.22–5.81)
RBC: 4.18 MIL/uL — ABNORMAL LOW (ref 4.22–5.81)
RBC: 4.05 MIL/uL — ABNORMAL LOW (ref 4.22–5.81)
RBC: 3.86 MIL/uL — ABNORMAL LOW (ref 4.22–5.81)
RDW: 13.6 % (ref 11.5–15.5)
RDW: 13.7 % (ref 11.5–15.5)
RDW: 13.5 % (ref 11.5–15.5)
RDW: 13.5 % (ref 11.5–15.5)
WBC: 15.5 10*3/uL — ABNORMAL HIGH (ref 4.0–10.5)
WBC: 16 10*3/uL — ABNORMAL HIGH (ref 4.0–10.5)
WBC: 10.9 10*3/uL — ABNORMAL HIGH (ref 4.0–10.5)
WBC: 13.5 10*3/uL — ABNORMAL HIGH (ref 4.0–10.5)
nRBC: 0 % (ref 0.0–0.2)
nRBC: 0 % (ref 0.0–0.2)
nRBC: 0 % (ref 0.0–0.2)
nRBC: 0 % (ref 0.0–0.2)

## 2020-04-05 LAB — BASIC METABOLIC PANEL
Anion gap: 8 (ref 5–15)
BUN: 8 mg/dL (ref 6–20)
CO2: 24 mmol/L (ref 22–32)
Calcium: 8.8 mg/dL — ABNORMAL LOW (ref 8.9–10.3)
Chloride: 104 mmol/L (ref 98–111)
Creatinine, Ser: 0.85 mg/dL (ref 0.61–1.24)
GFR calc Af Amer: >60 mL/min (ref >60)
GFR calc non Af Amer: >60 mL/min (ref >60)
Glucose, Bld: 145 mg/dL — ABNORMAL HIGH (ref 70–99)
Potassium: 3.9 mmol/L (ref 3.5–5.1)
Sodium: 136 mmol/L (ref 135–145)

## 2020-04-05 LAB — SURGICAL PCR SCREEN
MRSA, PCR: NEGATIVE
Staphylococcus aureus: NEGATIVE

## 2020-04-05 LAB — HEMOGLOBIN A1C
Hgb A1c MFr Bld: 5.7 % — ABNORMAL HIGH (ref 4.8–5.6)
Mean Plasma Glucose: 116.89 mg/dL

## 2020-04-05 LAB — CREATININE, SERUM
Creatinine, Ser: 0.88 mg/dL (ref 0.61–1.24)
GFR calc Af Amer: >60 mL/min (ref >60)
GFR calc non Af Amer: >60 mL/min (ref >60)

## 2020-04-05 LAB — HIV ANTIBODY (ROUTINE TESTING W REFLEX): HIV Screen 4th Generation wRfx: NONREACTIVE

## 2020-04-05 SURGERY — CLOSED REDUCTION, PELVIS, WITH PERCUTANEOUS FIXATION
Anesthesia: General | Laterality: Right

## 2020-04-05 MED ORDER — VITAMIN D 25 MCG (1000 UNIT) PO TABS
2000.0000 [IU] | ORAL_TABLET | Freq: Two times a day (BID) | ORAL | Status: DC
  Administered 2020-04-05 – 2020-04-06 (×2): 2000 [IU] via ORAL
  Filled 2020-04-05 (×2): qty 2

## 2020-04-05 MED ORDER — ONDANSETRON HCL 4 MG/2ML IJ SOLN
INTRAMUSCULAR | Status: AC
  Filled 2020-04-05: qty 2

## 2020-04-05 MED ORDER — ONDANSETRON HCL 4 MG/2ML IJ SOLN: 4.0000 mg | Freq: Four times a day (QID) | INTRAMUSCULAR | Status: DC | PRN

## 2020-04-05 MED ORDER — HYDROMORPHONE HCL 1 MG/ML IJ SOLN
INTRAMUSCULAR | Status: AC
  Filled 2020-04-05: qty 1

## 2020-04-05 MED ORDER — MIDAZOLAM HCL 2 MG/2ML IJ SOLN
INTRAMUSCULAR | Status: DC | PRN
Start: 2020-04-05 — End: 2020-04-05
  Administered 2020-04-05: 2 mg via INTRAVENOUS

## 2020-04-05 MED ORDER — ASCORBIC ACID 500 MG PO TABS
1000.0000 mg | ORAL_TABLET | Freq: Every day | ORAL | Status: DC
  Administered 2020-04-05 – 2020-04-06 (×2): 1000 mg via ORAL
  Filled 2020-04-05 (×2): qty 2

## 2020-04-05 MED ORDER — FENTANYL CITRATE (PF) 250 MCG/5ML IJ SOLN
INTRAMUSCULAR | Status: DC | PRN
  Administered 2020-04-05: 50 ug via INTRAVENOUS
  Administered 2020-04-05: 150 ug via INTRAVENOUS
  Administered 2020-04-05 (×2): 25 ug via INTRAVENOUS

## 2020-04-05 MED ORDER — BUPIVACAINE LIPOSOME 1.3 % IJ SUSP
20.0000 mL | Freq: Once | INTRAMUSCULAR | Status: DC
  Filled 2020-04-05: qty 20

## 2020-04-05 MED ORDER — CEFAZOLIN SODIUM-DEXTROSE 2-4 GM/100ML-% IV SOLN
2.0000 g | INTRAVENOUS | Status: AC
  Administered 2020-04-05: 2 g via INTRAVENOUS

## 2020-04-05 MED ORDER — MIDAZOLAM HCL 2 MG/2ML IJ SOLN: 0.5000 mg | Freq: Once | INTRAMUSCULAR | Status: DC | PRN

## 2020-04-05 MED ORDER — PROMETHAZINE HCL 25 MG/ML IJ SOLN
6.2500 mg | INTRAMUSCULAR | Status: DC | PRN
  Administered 2020-04-05: 6.25 mg via INTRAVENOUS

## 2020-04-05 MED ORDER — CEFAZOLIN SODIUM-DEXTROSE 1-4 GM/50ML-% IV SOLN: 1.0000 g | Freq: Four times a day (QID) | INTRAVENOUS | Status: DC

## 2020-04-05 MED ORDER — CHLORHEXIDINE GLUCONATE CLOTH 2 % EX PADS
6.0000 | MEDICATED_PAD | Freq: Every day | CUTANEOUS | Status: DC
  Administered 2020-04-05: 6 via TOPICAL

## 2020-04-05 MED ORDER — ALBUMIN HUMAN 5 % IV SOLN
INTRAVENOUS | Status: DC | PRN
Start: 2020-04-05 — End: 2020-04-05

## 2020-04-05 MED ORDER — ONDANSETRON HCL 4 MG/2ML IJ SOLN
INTRAMUSCULAR | Status: DC | PRN
Start: 2020-04-05 — End: 2020-04-05
  Administered 2020-04-05 (×2): 4 mg via INTRAVENOUS

## 2020-04-05 MED ORDER — FENTANYL CITRATE (PF) 250 MCG/5ML IJ SOLN
INTRAMUSCULAR | Status: AC
  Filled 2020-04-05: qty 5

## 2020-04-05 MED ORDER — BUPIVACAINE LIPOSOME 1.3 % IJ SUSP
INTRAMUSCULAR | Status: DC | PRN
  Administered 2020-04-05: 20 mL

## 2020-04-05 MED ORDER — LACTATED RINGERS IV SOLN: INTRAVENOUS | Status: DC

## 2020-04-05 MED ORDER — SUGAMMADEX SODIUM 200 MG/2ML IV SOLN
INTRAVENOUS | Status: DC | PRN
Start: 2020-04-05 — End: 2020-04-05
  Administered 2020-04-05: 120 mg via INTRAVENOUS

## 2020-04-05 MED ORDER — LIDOCAINE HCL (CARDIAC) PF 100 MG/5ML IV SOSY
PREFILLED_SYRINGE | INTRAVENOUS | Status: DC | PRN
  Administered 2020-04-05: 80 mg via INTRATRACHEAL

## 2020-04-05 MED ORDER — ADULT MULTIVITAMIN W/MINERALS CH
1.0000 | ORAL_TABLET | Freq: Every day | ORAL | Status: DC
  Administered 2020-04-05 – 2020-04-06 (×2): 1 via ORAL
  Filled 2020-04-05 (×2): qty 1

## 2020-04-05 MED ORDER — POVIDONE-IODINE 10 % EX SWAB: 2.0000 "application " | Freq: Once | CUTANEOUS | Status: DC

## 2020-04-05 MED ORDER — MEPERIDINE HCL 25 MG/ML IJ SOLN: 6.2500 mg | INTRAMUSCULAR | Status: DC | PRN

## 2020-04-05 MED ORDER — METHOCARBAMOL 500 MG PO TABS
500.0000 mg | ORAL_TABLET | Freq: Three times a day (TID) | ORAL | Status: DC | PRN
  Administered 2020-04-05: 500 mg via ORAL
  Filled 2020-04-05: qty 1

## 2020-04-05 MED ORDER — LIDOCAINE 2% (20 MG/ML) 5 ML SYRINGE
INTRAMUSCULAR | Status: AC
  Filled 2020-04-05: qty 5

## 2020-04-05 MED ORDER — ROCURONIUM BROMIDE 100 MG/10ML IV SOLN
INTRAVENOUS | Status: DC | PRN
Start: 2020-04-05 — End: 2020-04-05
  Administered 2020-04-05: 20 mg via INTRAVENOUS
  Administered 2020-04-05: 60 mg via INTRAVENOUS
  Administered 2020-04-05 (×2): 20 mg via INTRAVENOUS

## 2020-04-05 MED ORDER — ACETAMINOPHEN 500 MG PO TABS
ORAL_TABLET | ORAL | Status: AC
  Filled 2020-04-05: qty 2

## 2020-04-05 MED ORDER — PROMETHAZINE HCL 25 MG/ML IJ SOLN
INTRAMUSCULAR | Status: AC
  Filled 2020-04-05: qty 1

## 2020-04-05 MED ORDER — ONDANSETRON HCL 4 MG PO TABS: 4.0000 mg | ORAL_TABLET | Freq: Four times a day (QID) | ORAL | Status: DC | PRN

## 2020-04-05 MED ORDER — METOCLOPRAMIDE HCL 5 MG/ML IJ SOLN: 5.0000 mg | Freq: Three times a day (TID) | INTRAMUSCULAR | Status: DC | PRN

## 2020-04-05 MED ORDER — DOCUSATE SODIUM 100 MG PO CAPS
100.0000 mg | ORAL_CAPSULE | Freq: Two times a day (BID) | ORAL | Status: DC
  Administered 2020-04-05 – 2020-04-06 (×2): 100 mg via ORAL
  Filled 2020-04-05 (×2): qty 1

## 2020-04-05 MED ORDER — MORPHINE SULFATE (PF) 2 MG/ML IV SOLN
1.0000 mg | INTRAVENOUS | Status: DC | PRN
  Administered 2020-04-05 (×2): 2 mg via INTRAVENOUS
  Filled 2020-04-05 (×2): qty 1

## 2020-04-05 MED ORDER — DEXAMETHASONE SODIUM PHOSPHATE 10 MG/ML IJ SOLN
INTRAMUSCULAR | Status: DC | PRN
Start: 2020-04-05 — End: 2020-04-05
  Administered 2020-04-05: 10 mg via INTRAVENOUS

## 2020-04-05 MED ORDER — PHENYLEPHRINE 40 MCG/ML (10ML) SYRINGE FOR IV PUSH (FOR BLOOD PRESSURE SUPPORT)
PREFILLED_SYRINGE | INTRAVENOUS | Status: DC | PRN
  Administered 2020-04-05: 120 ug via INTRAVENOUS
  Administered 2020-04-05 (×2): 80 ug via INTRAVENOUS

## 2020-04-05 MED ORDER — CEFAZOLIN SODIUM-DEXTROSE 2-4 GM/100ML-% IV SOLN
2.0000 g | Freq: Three times a day (TID) | INTRAVENOUS | Status: AC
  Administered 2020-04-05 – 2020-04-06 (×2): 2 g via INTRAVENOUS
  Filled 2020-04-05 (×4): qty 100

## 2020-04-05 MED ORDER — METOCLOPRAMIDE HCL 5 MG PO TABS: 5.0000 mg | ORAL_TABLET | Freq: Three times a day (TID) | ORAL | Status: DC | PRN

## 2020-04-05 MED ORDER — ENOXAPARIN SODIUM 40 MG/0.4ML ~~LOC~~ SOLN
40.0000 mg | SUBCUTANEOUS | Status: DC
  Administered 2020-04-06: 40 mg via SUBCUTANEOUS
  Filled 2020-04-05: qty 0.4

## 2020-04-05 MED ORDER — HYDROMORPHONE HCL 1 MG/ML IJ SOLN
0.2500 mg | INTRAMUSCULAR | Status: DC | PRN
  Administered 2020-04-05 (×2): 0.5 mg via INTRAVENOUS

## 2020-04-05 MED ORDER — ACETAMINOPHEN 500 MG PO TABS
1000.0000 mg | ORAL_TABLET | Freq: Once | ORAL | Status: AC
  Administered 2020-04-05: 1000 mg via ORAL

## 2020-04-05 MED ORDER — ROCURONIUM BROMIDE 10 MG/ML (PF) SYRINGE
PREFILLED_SYRINGE | INTRAVENOUS | Status: AC
  Filled 2020-04-05: qty 10

## 2020-04-05 MED ORDER — OXYCODONE HCL 5 MG PO TABS
5.0000 mg | ORAL_TABLET | ORAL | Status: DC | PRN
  Administered 2020-04-05 – 2020-04-06 (×4): 10 mg via ORAL
  Filled 2020-04-05 (×4): qty 2

## 2020-04-05 MED ORDER — SODIUM CHLORIDE (PF) 0.9 % IJ SOLN
INTRAMUSCULAR | Status: DC | PRN
  Administered 2020-04-05: 20 mL via INTRAVENOUS

## 2020-04-05 MED ORDER — CHLORHEXIDINE GLUCONATE 4 % EX LIQD: 60.0000 mL | Freq: Once | CUTANEOUS | Status: DC

## 2020-04-05 MED ORDER — DEXAMETHASONE SODIUM PHOSPHATE 10 MG/ML IJ SOLN
INTRAMUSCULAR | Status: AC
  Filled 2020-04-05: qty 1

## 2020-04-05 MED ORDER — 0.9 % SODIUM CHLORIDE (POUR BTL) OPTIME
TOPICAL | Status: DC | PRN
  Administered 2020-04-05: 1000 mL

## 2020-04-05 MED ORDER — MIDAZOLAM HCL 2 MG/2ML IJ SOLN
INTRAMUSCULAR | Status: AC
  Filled 2020-04-05: qty 2

## 2020-04-05 MED ORDER — ACETAMINOPHEN 325 MG PO TABS
650.0000 mg | ORAL_TABLET | Freq: Four times a day (QID) | ORAL | Status: DC
  Administered 2020-04-06 (×2): 650 mg via ORAL
  Filled 2020-04-05 (×4): qty 2

## 2020-04-05 MED ORDER — PROPOFOL 10 MG/ML IV BOLUS
INTRAVENOUS | Status: DC | PRN
  Administered 2020-04-05: 150 mg via INTRAVENOUS

## 2020-04-05 SURGICAL SUPPLY — 67 items
BIT DRILL 4.9 CANNULATED (BIT) ×1
BIT DRILL AO MATTA 2.5MX230M (BIT) ×1 IMPLANT
BIT DRILL CANN QC 4.9 LRG (BIT) ×1 IMPLANT
BIT DRILL TWST MATTA 3.5MX195M (BIT) ×1 IMPLANT
BLADE CLIPPER SURG (BLADE) IMPLANT
BRUSH SCRUB EZ PLAIN DRY (MISCELLANEOUS) ×6 IMPLANT
COVER WAND RF STERILE (DRAPES) IMPLANT
DRAIN CHANNEL 15F RND FF W/TCR (WOUND CARE) IMPLANT
DRAPE C-ARM 42X72 X-RAY (DRAPES) ×3 IMPLANT
DRAPE C-ARMOR (DRAPES) ×3 IMPLANT
DRAPE LAPAROTOMY TRNSV 102X78 (DRAPES) ×3 IMPLANT
DRAPE SURG 17X23 STRL (DRAPES) ×3 IMPLANT
DRAPE U-SHAPE 47X51 STRL (DRAPES) ×3 IMPLANT
DRILL BIT AO MATTA 2.5MX230M (BIT) ×3
DRILL BIT CANNULATED 4.9 (BIT) ×3
DRILL TWIST AO MATTA 3.5MX195M (BIT) ×3
DRSG MEPILEX BORDER 4X4 (GAUZE/BANDAGES/DRESSINGS) ×9 IMPLANT
ELECT BLADE 4.0 EZ CLEAN MEGAD (MISCELLANEOUS) ×3
ELECT REM PT RETURN 9FT ADLT (ELECTROSURGICAL) ×3
ELECTRODE BLDE 4.0 EZ CLN MEGD (MISCELLANEOUS) ×1 IMPLANT
ELECTRODE REM PT RTRN 9FT ADLT (ELECTROSURGICAL) ×1 IMPLANT
EVACUATOR SILICONE 100CC (DRAIN) IMPLANT
GLOVE BIO SURGEON STRL SZ7.5 (GLOVE) ×3 IMPLANT
GLOVE BIO SURGEON STRL SZ8 (GLOVE) ×3 IMPLANT
GLOVE BIOGEL PI IND STRL 7.5 (GLOVE) ×1 IMPLANT
GLOVE BIOGEL PI IND STRL 8 (GLOVE) ×1 IMPLANT
GLOVE BIOGEL PI INDICATOR 7.5 (GLOVE) ×2
GLOVE BIOGEL PI INDICATOR 8 (GLOVE) ×2
GOWN STRL REUS W/ TWL LRG LVL3 (GOWN DISPOSABLE) ×2 IMPLANT
GOWN STRL REUS W/ TWL XL LVL3 (GOWN DISPOSABLE) ×1 IMPLANT
GOWN STRL REUS W/TWL LRG LVL3 (GOWN DISPOSABLE) ×4
GOWN STRL REUS W/TWL XL LVL3 (GOWN DISPOSABLE) ×2
GUIDEWIRE THRD ASNIS 3.2X300 (WIRE) ×6 IMPLANT
KIT BASIN OR (CUSTOM PROCEDURE TRAY) ×3 IMPLANT
KIT TURNOVER KIT B (KITS) ×3 IMPLANT
MANIFOLD NEPTUNE II (INSTRUMENTS) IMPLANT
NEEDLE HYPO 25GX1X1/2 BEV (NEEDLE) ×3 IMPLANT
NS IRRIG 1000ML POUR BTL (IV SOLUTION) ×6 IMPLANT
PACK TOTAL JOINT (CUSTOM PROCEDURE TRAY) ×3 IMPLANT
PAD ARMBOARD 7.5X6 YLW CONV (MISCELLANEOUS) ×6 IMPLANT
PIN APEX 6X180MM EXFIX (EXFIX) ×6 IMPLANT
SCREW CANN BONE 8.0X165MM (Screw) ×3 IMPLANT
SCREW CANN P/T ASNIS 6.5X70 (Screw) ×3 IMPLANT
SCREW CANN P/T ASNIS 6.5X75 (Screw) ×3 IMPLANT
SCREW CANN P/T ASNIS 8X85 (Screw) ×3 IMPLANT
SCREW CORTEX ST MATTA 3.5X100M (Screw) ×3 IMPLANT
SCREW CORTEX ST MATTA 3.5X60MM (Screw) ×3 IMPLANT
SCREW CORTEX ST MATTA 3.5X90MM (Screw) ×3 IMPLANT
SPONGE LAP 18X18 RF (DISPOSABLE) IMPLANT
STAPLER VISISTAT 35W (STAPLE) ×3 IMPLANT
SUCTION FRAZIER HANDLE 10FR (MISCELLANEOUS) ×3
SUCTION TUBE FRAZIER 10FR DISP (MISCELLANEOUS) ×1 IMPLANT
SUT ETHILON 2 0 PSLX (SUTURE) ×3 IMPLANT
SUT ETHILON 3 0 PS 1 (SUTURE) ×3 IMPLANT
SUT VIC AB 0 CT1 27 (SUTURE) ×3
SUT VIC AB 0 CT1 27XBRD ANBCTR (SUTURE) ×1 IMPLANT
SUT VIC AB 1 CT1 18XCR BRD 8 (SUTURE) IMPLANT
SUT VIC AB 1 CT1 8-18 (SUTURE)
SUT VIC AB 2-0 CT1 27 (SUTURE) ×2
SUT VIC AB 2-0 CT1 TAPERPNT 27 (SUTURE) ×1 IMPLANT
SYR CONTROL 10ML LL (SYRINGE) ×3 IMPLANT
TOWEL GREEN STERILE (TOWEL DISPOSABLE) ×6 IMPLANT
TOWEL GREEN STERILE FF (TOWEL DISPOSABLE) ×3 IMPLANT
TRAY FOLEY MTR SLVR 16FR STAT (SET/KITS/TRAYS/PACK) ×3 IMPLANT
WASHER SCREW MATTA SS 13.0X1.5 (Washer) ×6 IMPLANT
WATER STERILE IRR 1000ML POUR (IV SOLUTION) ×12 IMPLANT
YANKAUER SUCT BULB TIP NO VENT (SUCTIONS) ×3 IMPLANT
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
## 2020-04-05 NOTE — Anesthesia Postprocedure Evaluation (Signed)
Anesthesia Post Note  Patient: Ryan Cooper  Procedure(s) Performed: ORIF PELVIC FRACTURE WITH SI JOINT FUSION (Right )     Patient location during evaluation: PACU Anesthesia Type: General Level of consciousness: patient cooperative, sedated and oriented Pain management: pain level controlled Vital Signs Assessment: post-procedure vital signs reviewed and stable Respiratory status: spontaneous breathing, nonlabored ventilation and respiratory function stable Cardiovascular status: blood pressure returned to baseline and stable Postop Assessment: no apparent nausea or vomiting Anesthetic complications: no   No complications documented.  Last Vitals:  Vitals:   04/05/20 1950 04/05/20 2005  BP: 134/89 126/88  Pulse: 61 61  Resp: 12 12  Temp: 37.1 C   SpO2: 98% 97%    Last Pain:  Vitals:   04/05/20 1950  TempSrc:   PainSc: Asleep                 [name],E. Kage Willmann

## 2020-04-05 NOTE — Brief Op Note (Signed)
04/05/2020  6:31 PM  PATIENT:  Ryan Cooper  29 y.o. male  PRE-OPERATIVE DIAGNOSIS:   1. RIGHT ILIAC FRACTURE 2. RIGHT SACRO-LIIAC DISLOCATION 3. RIGHT SACRAL IMPACTION FRACTURE  POST-OPERATIVE DIAGNOSIS:  1. RIGHT ILIAC FRACTURE 2. RIGHT SACRO-LIIAC DISLOCATION 3. RIGHT SACRAL IMPACTION FRACTURE  PROCEDURES:  Procedure(s): 1. ORIF ILIAC FRACTURE 2. SACRO-ILIAC SCREW FIXATION S1 AND S2  SURGEON:  Surgeon(s) and Role:    * Myrene Galas, MD - Primary  PHYSICIAN ASSISTANT: Montez Morita, PA-C  ANESTHESIA:   general  EBL:  150 mL   BLOOD ADMINISTERED:none  DRAINS: none   LOCAL MEDICATIONS USED:  NONE  SPECIMEN:  No Specimen  DISPOSITION OF SPECIMEN:  N/A  COUNTS:  YES  TOURNIQUET:  * No tourniquets in log *  DICTATION: .[id number]  PLAN OF CARE: Admit to inpatient   PATIENT DISPOSITION:  PACU - hemodynamically stable.   Delay start of Pharmacological VTE agent (>24hrs) due to surgical blood loss or risk of bleeding: no
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
## 2020-04-05 NOTE — Plan of Care (Signed)
°  Problem: Coping: °Goal: Level of anxiety will decrease °Outcome: Progressing °  °

## 2020-04-05 NOTE — Anesthesia Procedure Notes (Signed)
Procedure Name: Intubation Date/Time: 04/05/2020 3:25 PM Performed by: Laretta Alstrom, CRNA Pre-anesthesia Checklist: Patient identified, Emergency Drugs available, Suction available and Patient being monitored Patient Re-evaluated:Patient Re-evaluated prior to induction Oxygen Delivery Method: Circle System Utilized Preoxygenation: Pre-oxygenation with 100% oxygen Induction Type: IV induction Ventilation: Mask ventilation without difficulty Laryngoscope Size: Mac and 4 Grade View: Grade II Tube type: Oral Number of attempts: 1 Airway Equipment and Method: Stylet Placement Confirmation: ETT inserted through vocal cords under direct vision,  positive ETCO2 and breath sounds checked- equal and bilateral Secured at: 23 cm Tube secured with: Tape Dental Injury: Teeth and Oropharynx as per pre-operative assessment  Comments: Atraumatic oral intubation by Ryan Cooper., SRNA under direct supervision.

## 2020-04-05 NOTE — Transfer of Care (Signed)
Immediate Anesthesia Transfer of Care Note  Patient: Ryan Cooper  Procedure(s) Performed: ORIF PELVIC FRACTURE WITH SI JOINT FUSION (Right )  Patient Location: PACU  Anesthesia Type:General  Level of Consciousness: awake, alert , oriented and patient cooperative  Airway & Oxygen Therapy: Patient Spontanous Breathing and Patient connected to nasal cannula oxygen  Post-op Assessment: Report given to RN and Post -op Vital signs reviewed and stable  Post vital signs: Reviewed and stable  Last Vitals:  Vitals Value Taken Time  BP    Temp    Pulse 67 04/05/20 1849  Resp 14 04/05/20 1849  SpO2 100 % 04/05/20 1849  Vitals shown include unvalidated device data.  Last Pain:  Vitals:   04/05/20 1141  TempSrc: Oral  PainSc:       Patients Stated Pain Goal: 2 (04/05/20 0142)  Complications: No complications documented.
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
## 2020-04-05 NOTE — TOC CAGE-AID Note (Signed)
Transition of Care [facility]) - CAGE-AID Screening   Patient Details  Name: Ryan Cooper MRN: [id number] Date of Birth: Aug 10, 1991  Transition of Care [facility] CM/SW Contact:    Emeterio Reeve, [place] Phone Number: 04/05/2020, 1:49 PM   Clinical Narrative:  CSW met with pt at bedside. CSW introduced self and explained her role at the hospital.  Pt denied alcohol use. Pt reports regular marijuana use. Pt reports no interest in stopping use. Pt reports he took 1 percocet before coming to the hospital for pain. Pt reports that was a one time use. Pt was receptive to counseling and denied need for resources.   CAGE-AID Screening:    Have You Ever Felt You Ought to Cut Down on Your Drinking or Drug Use?: No Have People Annoyed You By Critizing Your Drinking Or Drug Use?: No Have You Felt Bad Or Guilty About Your Drinking Or Drug Use?: No Have You Ever Had a Drink or Used Drugs First Thing In The Morning to Steady Your Nerves or to Get Rid of a Hangover?: No CAGE-AID Score: 0  Substance Abuse Education Offered: Yes     Blima Ledger, [place] Social Worker [phone]
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
## 2020-04-06 LAB — BASIC METABOLIC PANEL
Anion gap: 10 (ref 5–15)
BUN: 5 mg/dL — ABNORMAL LOW (ref 6–20)
CO2: 29 mmol/L (ref 22–32)
Calcium: 9.2 mg/dL (ref 8.9–10.3)
Chloride: 96 mmol/L — ABNORMAL LOW (ref 98–111)
Creatinine, Ser: 1 mg/dL (ref 0.61–1.24)
GFR calc Af Amer: >60 mL/min (ref >60)
GFR calc non Af Amer: >60 mL/min (ref >60)
Glucose, Bld: 117 mg/dL — ABNORMAL HIGH (ref 70–99)
Potassium: 3.5 mmol/L (ref 3.5–5.1)
Sodium: 135 mmol/L (ref 135–145)

## 2020-04-06 LAB — CBC
HCT: 35.7 % — ABNORMAL LOW (ref 39.0–52.0)
Hemoglobin: 11.9 g/dL — ABNORMAL LOW (ref 13.0–17.0)
MCH: 30.5 pg (ref 26.0–34.0)
MCHC: 33.3 g/dL (ref 30.0–36.0)
MCV: 91.5 fL (ref 80.0–100.0)
Platelets: 145 10*3/uL — ABNORMAL LOW (ref 150–400)
RBC: 3.9 MIL/uL — ABNORMAL LOW (ref 4.22–5.81)
RDW: 13.5 % (ref 11.5–15.5)
WBC: 12.2 10*3/uL — ABNORMAL HIGH (ref 4.0–10.5)
nRBC: 0 % (ref 0.0–0.2)

## 2020-04-06 LAB — VITAMIN D 25 HYDROXY (VIT D DEFICIENCY, FRACTURES): Vit D, 25-Hydroxy: 20.34 ng/mL — ABNORMAL LOW (ref 30–100)

## 2020-04-06 MED ORDER — ADULT MULTIVITAMIN W/MINERALS CH: 1.0000 | ORAL_TABLET | Freq: Every day | ORAL | 0 refills | Status: AC

## 2020-04-06 MED ORDER — VITAMIN D3 25 MCG PO TABS: 2000.0000 [IU] | ORAL_TABLET | Freq: Two times a day (BID) | ORAL | 3 refills | Status: AC

## 2020-04-06 MED ORDER — BACITRACIN ZINC 500 UNIT/GM EX OINT
TOPICAL_OINTMENT | Freq: Two times a day (BID) | CUTANEOUS | Status: DC
  Administered 2020-04-06: 1 via TOPICAL
  Filled 2020-04-06: qty 28.35

## 2020-04-06 MED ORDER — APIXABAN 2.5 MG PO TABS
2.5000 mg | ORAL_TABLET | Freq: Two times a day (BID) | ORAL | 0 refills | Status: DC
Start: 2020-04-06 — End: 2023-04-18

## 2020-04-06 MED ORDER — POLYETHYLENE GLYCOL 3350 17 G PO PACK
17.0000 g | PACK | Freq: Every day | ORAL | Status: DC
  Administered 2020-04-06: 17 g via ORAL
  Filled 2020-04-06: qty 1

## 2020-04-06 MED ORDER — VITAMIN D (ERGOCALCIFEROL) 1.25 MG (50000 UNIT) PO CAPS
50000.0000 [IU] | ORAL_CAPSULE | ORAL | 0 refills | Status: DC
Start: 2020-04-06 — End: 2023-04-18

## 2020-04-06 MED ORDER — METHOCARBAMOL 500 MG PO TABS
500.0000 mg | ORAL_TABLET | Freq: Three times a day (TID) | ORAL | Status: DC
  Administered 2020-04-06 (×2): 500 mg via ORAL
  Filled 2020-04-06 (×2): qty 1

## 2020-04-06 MED ORDER — OXYCODONE HCL 5 MG PO TABS: 5.0000 mg | ORAL_TABLET | Freq: Four times a day (QID) | ORAL | 0 refills | Status: DC | PRN

## 2020-04-06 MED ORDER — ACETAMINOPHEN 325 MG PO TABS: 650.0000 mg | ORAL_TABLET | Freq: Four times a day (QID) | ORAL | 0 refills | Status: DC

## 2020-04-06 MED ORDER — ASCORBIC ACID 1000 MG PO TABS: 1000.0000 mg | ORAL_TABLET | Freq: Every day | ORAL | 2 refills | Status: AC

## 2020-04-06 MED ORDER — SODIUM CHLORIDE 0.9 % IV BOLUS
500.0000 mL | Freq: Once | INTRAVENOUS | Status: AC
  Administered 2020-04-06: 500 mL via INTRAVENOUS

## 2020-04-06 MED ORDER — DOCUSATE SODIUM 100 MG PO CAPS: 100.0000 mg | ORAL_CAPSULE | Freq: Two times a day (BID) | ORAL | 0 refills | Status: DC

## 2020-04-06 MED FILL — DOCUSATE SODIUM 100 MG CAPS: 100 | 5 days supply | Qty: 10 | Fill #0

## 2020-04-06 MED FILL — VITAMIN C 500 MG TABLET: 500 | 30 days supply | Qty: 60 | Fill #0

## 2020-04-06 MED FILL — oxyCODONE HCL 5 MG TABS: 5 | 7 days supply | Qty: 50 | Fill #0

## 2020-04-06 MED FILL — ELIQUIS 2.5 MG TABLET: 2.5 | 30 days supply | Qty: 60 | Fill #0

## 2020-04-06 MED FILL — VIT D2 1.25 MG (50,000 UNIT: 1.25 MG | 30 days supply | Qty: 5 | Fill #0

## 2020-04-06 MED FILL — ACETAMINOPHEN 325 MG TABS: 325 | 7 days supply | Qty: 60 | Fill #0

## 2020-04-06 MED FILL — VITAMIN D3 25 MCG TABS: 25 | 30 days supply | Qty: 120 | Fill #0
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
## 2020-04-06 NOTE — Plan of Care (Signed)

## 2020-04-06 NOTE — Progress Notes (Signed)
Orthopedic Tech Progress Note Patient Details:  Ryan Cooper 23-Jun-1991 [id number]  Ortho Devices Type of Ortho Device: Postop shoe/boot Ortho Device/Splint Location: RLE Ortho Device/Splint Interventions: Ordered, Application   Post Interventions Patient Tolerated: Well Instructions Provided: Care of device   Donald Pore 04/06/2020, 12:13 PM

## 2020-04-06 NOTE — Care Management (Signed)
Prescriptions were sent to [facility] pharmacy , and paid for by [facility] Department. Ordered walker with Velna Hatchet with [employer].  Ronny Flurry RN

## 2020-04-06 NOTE — Discharge Instructions (Addendum)
Simple Pelvic Fracture, Adult A pelvic fracture is a break in one of the bones in the pelvis. The pelvic bones include the bones that you sit on and the bones that make up the lower part of your spine. A pelvic fracture is called simple if:  There is only one break.  The broken bone is stable.  The broken bone is not moving out of place.  The bone does not pierce the skin. A pelvic fracture may occur along with injuries to nerves, blood vessels, soft tissues, the urinary tract, and abdominal organs. What are the causes? Common causes of this type of fracture include:  A fall.  A car accident.  Force or pressure that hits the pelvis. What increases the risk? You are more likely to get this injury if you:  Play high-impact sports, such as rugby or football.  You have thinning or weakening of your bones, such as from osteopenia or osteoporosis.  Have cancer that has spread to the bone.  Have a condition that is associated with falling, such as Parkinson's disease or seizure.  Have had a stroke.  Smoke. What are the signs or symptoms? Signs and symptoms may include:  Tenderness, swelling, or bruising in the affected area.  Pain when moving the hip.  Pain when walking or standing. How is this diagnosed? This condition is diagnosed with a physical exam, X-ray, or CT scan. You may also have blood or urine tests:  To rule out damage to other organs, such as the urethra.  To check for internal bleeding in the pelvic area. How is this treated? The goal of treatment is to get the bone to heal in its original position. Treatment includes:  Staying in bed (bed rest).  Using crutches, a walker, or a wheelchair until the bone heals.  Medicines to treat pain.  Medicines to prevent blood clots from forming in your legs.  Physical therapy. Follow these instructions at home: Medicines  Take over-the-counter and prescription medicines only as told by your health care  provider.  Do not drive or use heavy machinery while taking prescription pain medicine. Managing pain, stiffness, and swelling   If directed, apply ice to the injured area: ? Put ice in a plastic bag. ? Place a towel between your skin and the bag. ? Leave the ice on for 20 minutes, 2-3 times a day.  Gently move your toes often to avoid stiffness and to lessen swelling. Activity  Stay on bed rest for as long as directed by your health care provider.  While on bed rest: ? Change the position of your legs every 1-2 hours. This keeps blood moving well through both of your legs. ? You may sit for as long as you feel comfortable.  After bed rest: ? Avoid strenuous activities for as long as directed by your health care provider. ? Return to your normal activities as directed by your health care provider. Ask your health care provider what activities are safe for you.  Use items to help you with your activities, such as: ? A long-handled shoehorn to help you put your shoes on. ? Elastic shoelaces that do not need to be retied. ? A reacher or grabber to pick items up off the floor. General instructions   Do not  drive or operate heavy machinery until your health care provider tells you it is safe to do so.  Use a wheelchair or assistive devices as directed by your health care provider. When you  are ready to walk, start by using crutches or a walker to help support your body weight.  Have someone help you at home as you recover.  Wear compression stockings as told by your health care provider.  Do not use any products that contain nicotine or tobacco, such as cigarettes and e-cigarettes. These can delay bone healing. If you need help quitting, ask your health care provider.  If you have an underlying condition that caused your pelvic fracture, work with your health care provider to manage your condition.  Keep all follow-up visits as told by your health care provider. This is  important. Contact a health care provider if:  Your pain gets worse.  Your pain is not relieved with medicines. Get help right away if you:  Feel light-headed or faint.  Develop chest pain.  Develop shortness of breath.  Have a fever.  Have blood in your urine or your stools.  Have bleeding in your vagina.  Have difficulty or pain with urination or with passing stool.  Have difficulty or increased pain with walking.  Have new or increased swelling in one of your legs.  Have numbness in your legs or groin area. Summary  A pelvic fracture is a break in one of the bones in the pelvis. These are the bones that you sit on and the bones that make up the lower part of your spine.  A pelvic fracture is called simple if there is only one break, the broken bone is stable, the broken bone is not moving out of place, or the bone does not pierce the skin.  Common causes of this type of fracture include a fall, a car accident, or a force or pressure that hits the pelvis.  The goal of treatment is to get the bone to heal in its original position.  Treatment includes bed rest and using a wheelchair. When ready to walk, you may use crutches or a walker until your bone heals. Other treatments include physical therapy and medicine to treat pain and prevent blood clots. This information is not intended to replace advice given to you by your health care provider. Make sure you discuss any questions you have with your health care provider. Document Revised: 03/13/2018 Document Reviewed: 09/16/2017 Elsevier Patient Education  2020 [employer].   Orthopaedic Trauma Service Discharge Instructions   General Discharge Instructions  Orthopaedic Injuries:  Right pelvic ring fracture treated with screws              R foot fracture treated without surgery  WEIGHT BEARING STATUS: nonweightbearing right leg, use walker   RANGE OF MOTION/ACTIVITY: unrestricted range of motion hip, knee and  ankle   Bone health:  Labs show vitamin d insufficiency. You have been prescribed 2 types of vitamin d please take both to optimize healing and bone health. Take vitamin c and vitamin d as well   Wound Care: daily wound care to right flank/pelvis starting on 04/08/2020, see below   Discharge Wound Care Instructions  Do NOT apply any ointments, solutions or lotions to pin sites or surgical wounds.  These prevent needed drainage and even though solutions like hydrogen peroxide kill bacteria, they also damage cells lining the pin sites that help fight infection.  Applying lotions or ointments can keep the wounds moist and can cause them to breakdown and open up as well. This can increase the risk for infection. When in doubt call the office.  Surgical incisions should be dressed daily.  If  any drainage is noted, use one layer of adaptic, then gauze and tape. Alternatively you can use mepliex dressings (beige dressings you have on currently)  Once the incision is completely dry and without drainage, it may be left open to air out.  Showering may begin 36-48 hours later.  Cleaning gently with soap and water.  DVT/PE prophylaxis: eliquis 2.5 mg tablet 2x a day x 4 weeks   Diet: as you were eating previously.  Can use over the counter stool softeners and bowel preparations, such as Miralax, to help with bowel movements.  Narcotics can be constipating.  Be sure to drink plenty of fluids  PAIN MEDICATION USE AND EXPECTATIONS  You have likely been given narcotic medications to help control your pain.  After a traumatic event that results in an fracture (broken bone) with or without surgery, it is ok to use narcotic pain medications to help control one's pain.  We understand that everyone responds to pain differently and each individual patient will be evaluated on a regular basis for the continued need for narcotic medications. Ideally, narcotic medication use should last no more than 6-8 weeks (coinciding  with fracture healing).   As a patient it is your responsibility as well to monitor narcotic medication use and report the amount and frequency you use these medications when you come to your office visit.   We would also advise that if you are using narcotic medications, you should take a dose prior to therapy to maximize you participation.  IF YOU ARE ON NARCOTIC MEDICATIONS IT IS NOT PERMISSIBLE TO OPERATE A MOTOR VEHICLE (MOTORCYCLE/CAR/TRUCK/MOPED) OR HEAVY MACHINERY DO NOT MIX NARCOTICS WITH OTHER CNS (CENTRAL NERVOUS SYSTEM) DEPRESSANTS SUCH AS ALCOHOL   STOP SMOKING OR USING NICOTINE PRODUCTS!!!!  As discussed nicotine severely impairs your body's ability to heal surgical and traumatic wounds but also impairs bone healing.  Wounds and bone heal by forming microscopic blood vessels (angiogenesis) and nicotine is a vasoconstrictor (essentially, shrinks blood vessels).  Therefore, if vasoconstriction occurs to these microscopic blood vessels they essentially disappear and are unable to deliver necessary nutrients to the healing tissue.  This is one modifiable factor that you can do to dramatically increase your chances of healing your injury.    (This means no smoking, no nicotine gum, patches, etc)  DO NOT USE NONSTEROIDAL ANTI-INFLAMMATORY DRUGS (NSAID'S)  Using products such as Advil (ibuprofen), Aleve (naproxen), Motrin (ibuprofen) for additional pain control during fracture healing can delay and/or prevent the healing response.  If you would like to take over the counter (OTC) medication, Tylenol (acetaminophen) is ok.  However, some narcotic medications that are given for pain control contain acetaminophen as well. Therefore, you should not exceed more than 4000 mg of tylenol in a day if you do not have liver disease.  Also note that there are may OTC medicines, such as cold medicines and allergy medicines that my contain tylenol as well.  If you have any questions about medications and/or  interactions please ask your doctor/PA or your pharmacist.      ICE AND ELEVATE INJURED/OPERATIVE EXTREMITY  Using ice and elevating the injured extremity above your heart can help with swelling and pain control.  Icing in a pulsatile fashion, such as 20 minutes on and 20 minutes off, can be followed.    Do not place ice directly on skin. Make sure there is a barrier between to skin and the ice pack.    Using frozen items such as frozen peas  works well as the conform nicely to the are that needs to be iced.  USE AN ACE WRAP OR TED HOSE FOR SWELLING CONTROL  In addition to icing and elevation, Ace wraps or TED hose are used to help limit and resolve swelling.  It is recommended to use Ace wraps or TED hose until you are informed to stop.    When using Ace Wraps start the wrapping distally (farthest away from the body) and wrap proximally (closer to the body)   Example: If you had surgery on your leg or thing and you do not have a splint on, start the ace wrap at the toes and work your way up to the thigh        If you had surgery on your upper extremity and do not have a splint on, start the ace wrap at your fingers and work your way up to the upper arm  IF YOU ARE IN A SPLINT OR CAST DO NOT REMOVE IT FOR ANY REASON   If your splint gets wet for any reason please contact the office immediately. You may shower in your splint or cast as long as you keep it dry.  This can be done by wrapping in a cast cover or garbage back (or similar)  Do Not stick any thing down your splint or cast such as pencils, money, or hangers to try and scratch yourself with.  If you feel itchy take benadryl as prescribed on the bottle for itching  IF YOU ARE IN A CAM BOOT (BLACK BOOT)  You may remove boot periodically. Perform daily dressing changes as noted below.  Wash the liner of the boot regularly and wear a sock when wearing the boot. It is recommended that you sleep in the boot until told otherwise    Call office  for the following:  Temperature greater than 101F  Persistent nausea and vomiting  Severe uncontrolled pain  Redness, tenderness, or signs of infection (pain, swelling, redness, odor or green/yellow discharge around the site)  Difficulty breathing, headache or visual disturbances  Hives  Persistent dizziness or light-headedness  Extreme fatigue  Any other questions or concerns you may have after discharge  In an emergency, call 911 or go to an Emergency Department at a nearby hospital   CALL THE OFFICE WITH ANY QUESTIONS OR CONCERNS: [phone]   VISIT OUR WEBSITE FOR ADDITIONAL INFORMATION: [email or web address]    Constipation prevention: Drink plenty of fluids. Prune juice may be helpful. You may use a stool softener, such as Colace (over the counter) 100 mg twice a day. Use MiraLax (over the counter) for constipation as needed.  Driving restrictions: No driving until further notice
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
## 2020-04-06 NOTE — Progress Notes (Signed)
Patient was educated on discharge instructions and new medications. Patient was given a copy of the AVS and had received prescription medications. Patient was also given some extra dressing supplies in case he needed them at home. The patient's walker did not arrive to the unit by 6pm so the patient left with his mother via a wheelchair to go home. Patient's mother stated she would buy a walker from outside the hospital.

## 2020-04-07 LAB — PTH, INTACT AND CALCIUM
Calcium, Total (PTH): 9.1 mg/dL (ref 8.7–10.2)
PTH: 16 pg/mL (ref 15–65)

## 2020-04-07 LAB — TESTOSTERONE: Testosterone: 179 ng/dL — ABNORMAL LOW (ref 264–916)

## 2020-04-07 LAB — CALCIUM, IONIZED: Calcium, Ionized, Serum: 5.1 mg/dL (ref 4.5–5.6)

## 2020-04-07 LAB — SEX HORMONE BINDING GLOBULIN: Sex Hormone Binding: 37.2 nmol/L (ref 16.5–55.9)

## 2020-04-08 ENCOUNTER — Encounter (HOSPITAL_COMMUNITY): Payer: Self-pay | Admitting: Orthopedic Surgery

## 2020-04-10 LAB — TESTOSTERONE, FREE: Testosterone, Free: 2.8 pg/mL — ABNORMAL LOW (ref 9.3–26.5)

## 2020-04-12 LAB — TESTOSTERONE, % FREE: Testosterone-% Free: 1 % — ABNORMAL HIGH (ref 0.2–0.7)
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
## 2021-11-10 IMAGING — RF DG SI JOINTS 3+V
1 series · 15 of 23 positions shown · non-contrast
Comparison: 04/04/2020

CLINICAL DATA: Right SI joint fusion, pelvic fracture

EXAM:
BILATERAL SACROILIAC JOINTS - 3+ VIEW; DG C-ARM 1-60 MIN

[Series 1: run · 15 of 23 slices shown]
[im 1/23]
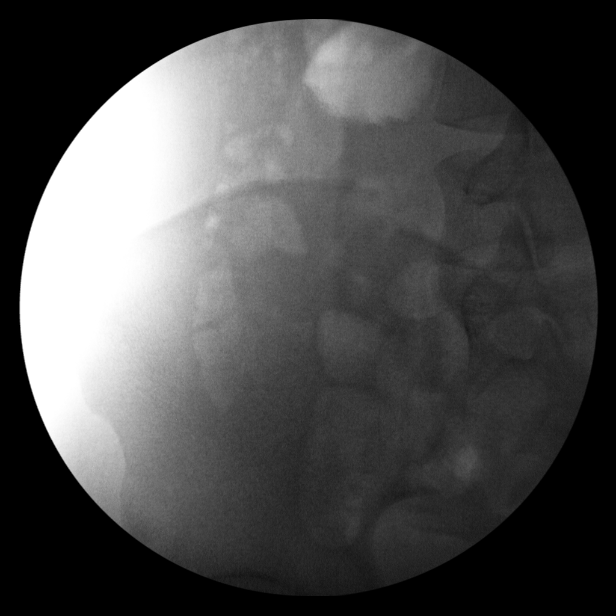
[im 3/23]
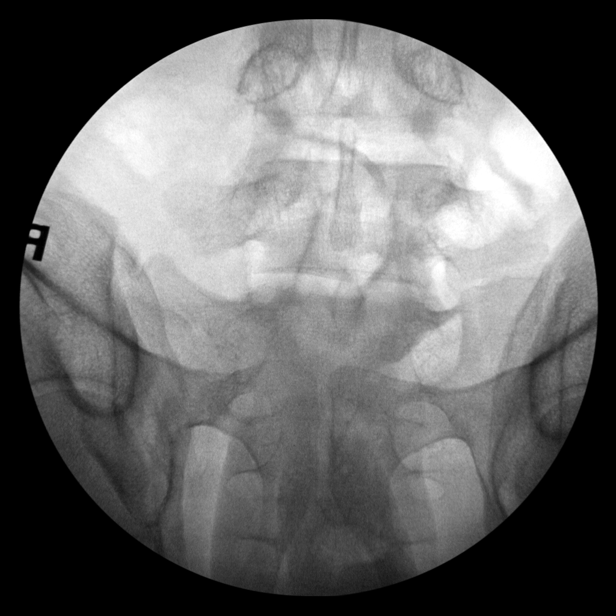
[im 4/23]
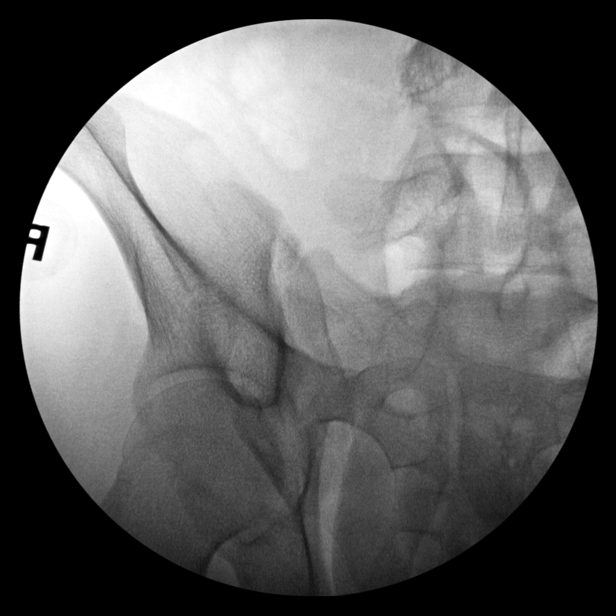
[im 6/23]
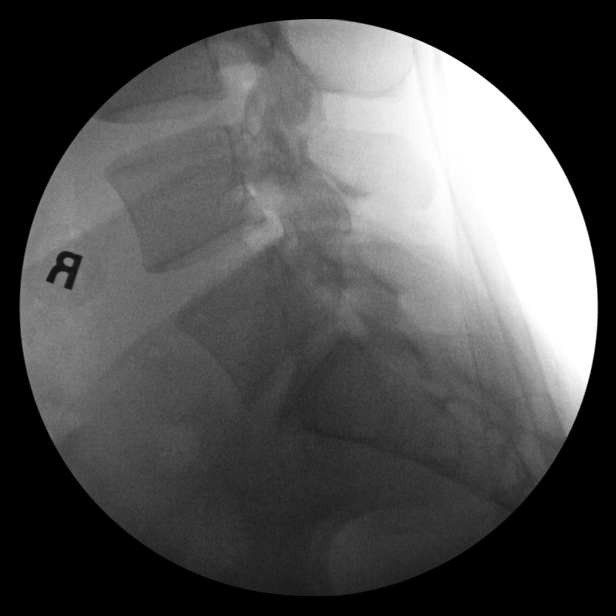
[im 7/23]
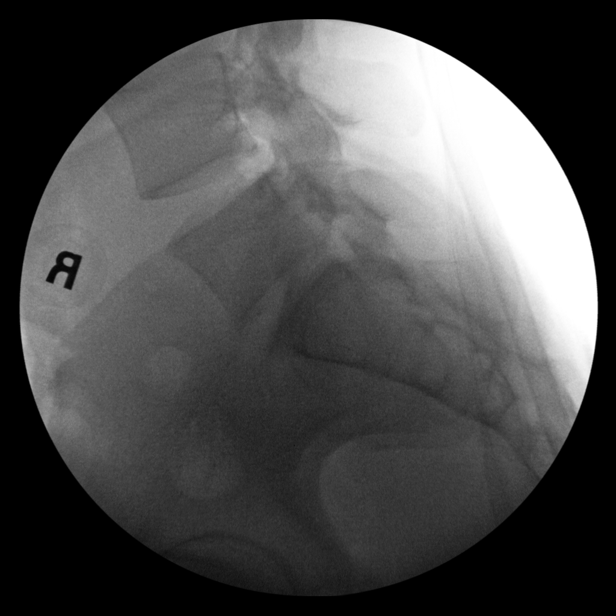
[im 9/23]
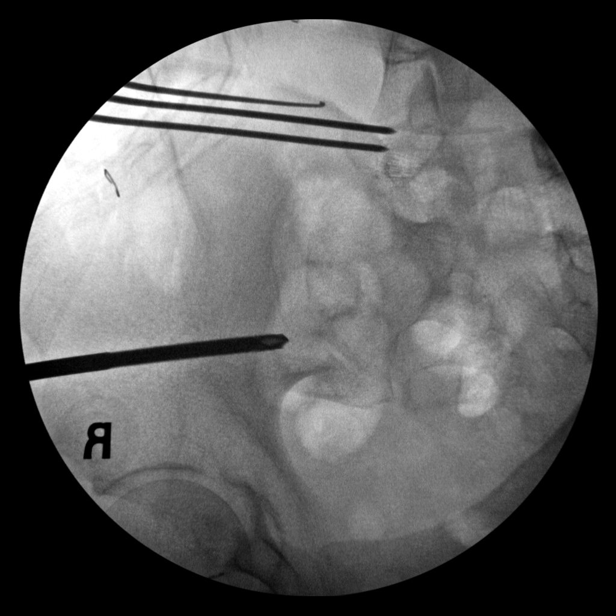
[im 10/23]
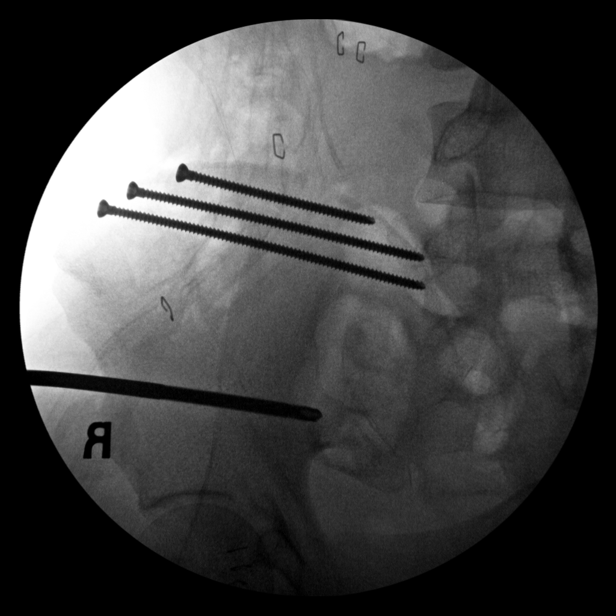
[im 12/23]
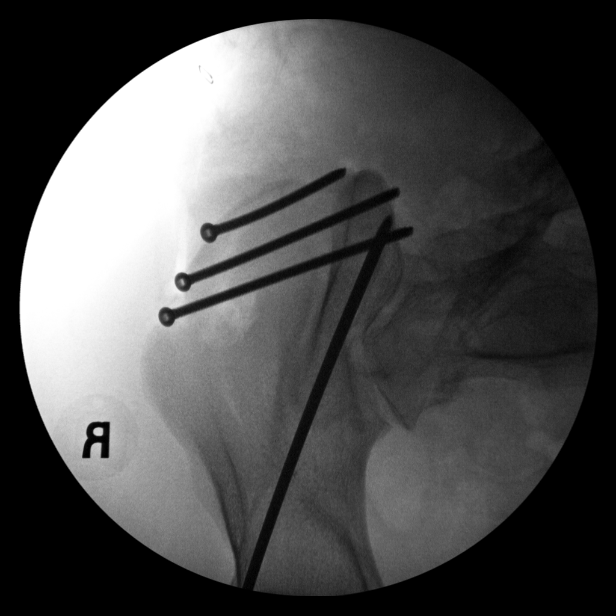
[im 14/23]
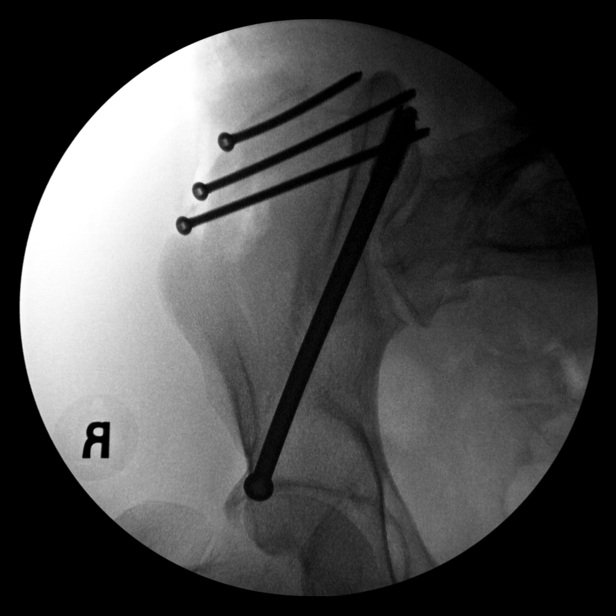
[im 15/23]
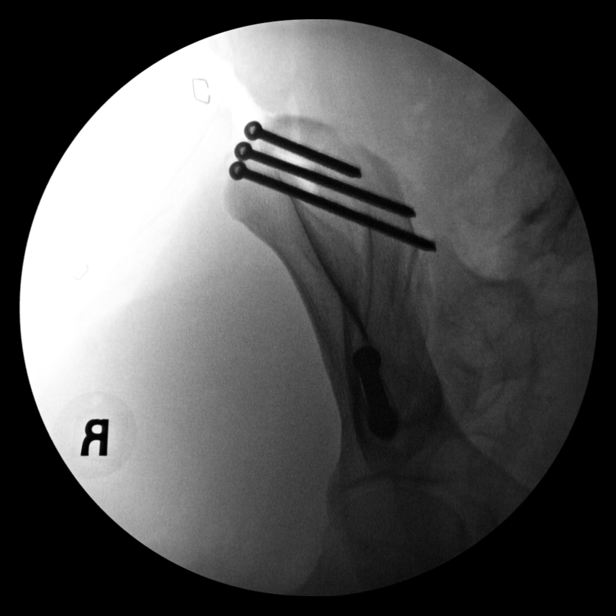
[im 17/23]
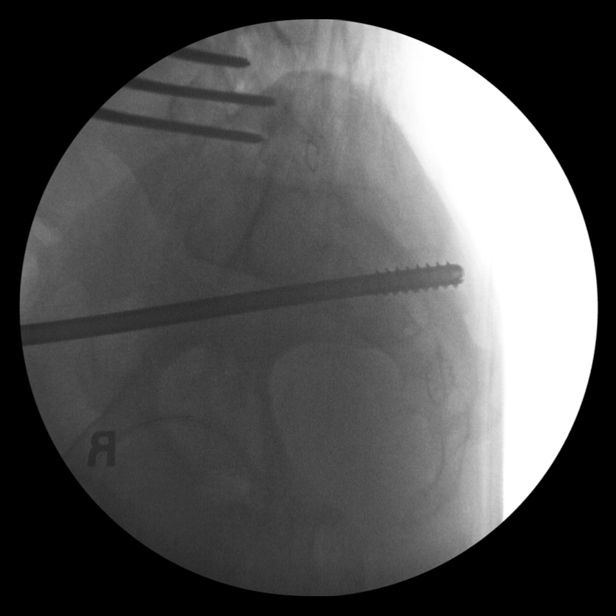
[im 18/23]
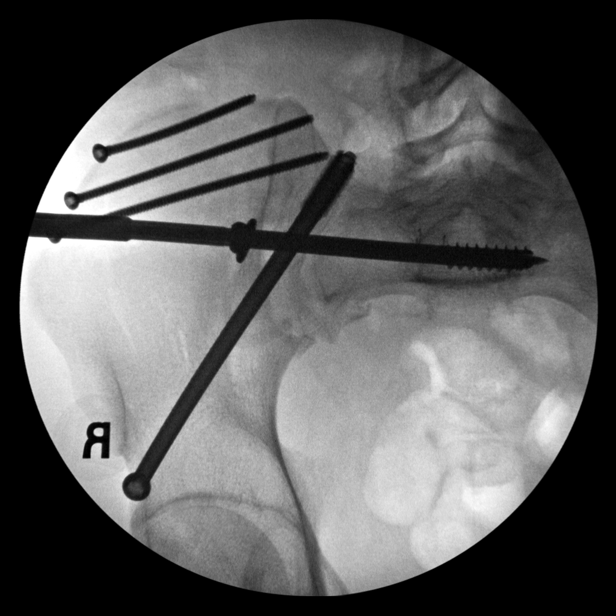
[im 20/23]
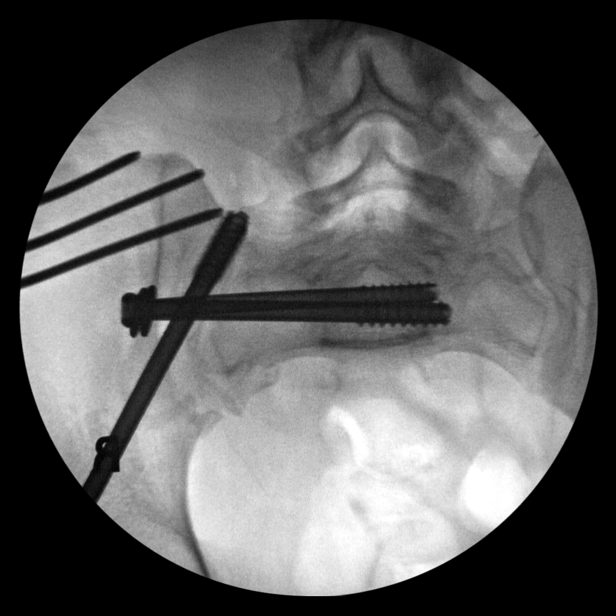
[im 21/23]
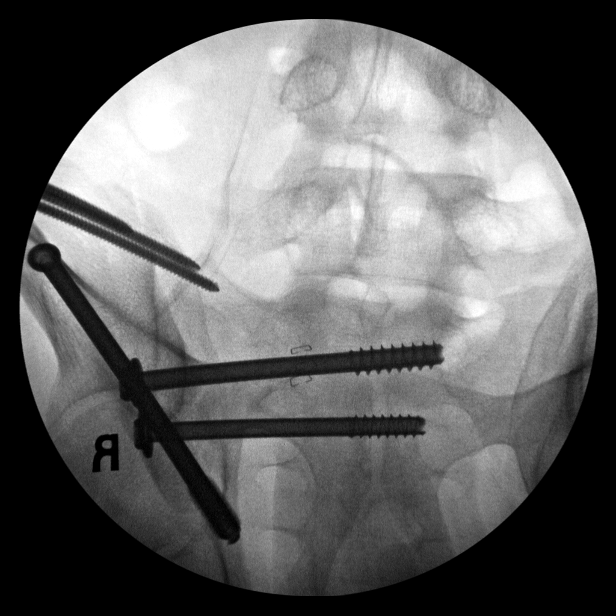
[im 23/23]
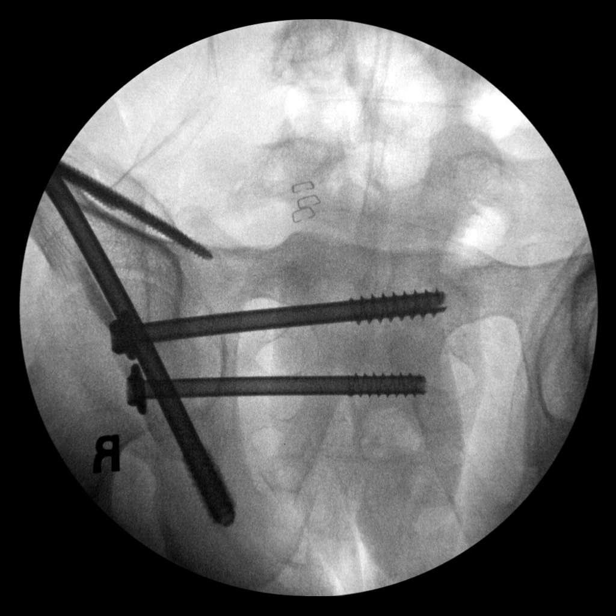

[15 of 23 positions shown; findings below may reference images not displayed]

FINDINGS: Twenty-four fluoroscopic images are obtained during the performance
of the procedure and are provided for interpretation only. Images
demonstrate placement of cannulated screws traversing the right
sacroiliac joint, with multiple screws traversing a comminuted right
iliac crest fracture. Alignment is near anatomic. Please refer to
operative report.

FLUOROSCOPY TIME:  2 minutes 25 seconds
IMPRESSION: 1. ORIF right pelvic fracture as above.

## 2023-04-17 ENCOUNTER — Other Ambulatory Visit: Payer: Self-pay | Admitting: Orthopedic Surgery

## 2023-04-18 ENCOUNTER — Encounter (HOSPITAL_BASED_OUTPATIENT_CLINIC_OR_DEPARTMENT_OTHER): Payer: Self-pay | Admitting: Orthopedic Surgery

## 2023-04-18 ENCOUNTER — Other Ambulatory Visit: Payer: Self-pay

## 2023-04-23 ENCOUNTER — Ambulatory Visit (HOSPITAL_BASED_OUTPATIENT_CLINIC_OR_DEPARTMENT_OTHER): Payer: Self-pay

## 2023-04-23 ENCOUNTER — Encounter (HOSPITAL_BASED_OUTPATIENT_CLINIC_OR_DEPARTMENT_OTHER): Payer: Self-pay | Admitting: Orthopedic Surgery

## 2023-04-23 ENCOUNTER — Encounter (HOSPITAL_BASED_OUTPATIENT_CLINIC_OR_DEPARTMENT_OTHER)
Admission: RE | Disposition: A | Discharge status: Home / Self Care | Payer: Self-pay | Source: Home / Self Care | Attending: Orthopedic Surgery

## 2023-04-23 ENCOUNTER — Other Ambulatory Visit: Payer: Self-pay

## 2023-04-23 ENCOUNTER — Ambulatory Visit (HOSPITAL_BASED_OUTPATIENT_CLINIC_OR_DEPARTMENT_OTHER): Payer: Self-pay | Admitting: Certified Registered Nurse Anesthetist

## 2023-04-23 ENCOUNTER — Ambulatory Visit (HOSPITAL_BASED_OUTPATIENT_CLINIC_OR_DEPARTMENT_OTHER)
Admission: RE | Admit: 2023-04-23 | Discharge: 2023-04-23 | Disposition: A | Discharge status: Home / Self Care | Payer: Self-pay | Attending: Orthopedic Surgery | Admitting: Orthopedic Surgery

## 2023-04-23 DIAGNOSIS — F1290 Cannabis use, unspecified, uncomplicated: Secondary | ICD-10-CM | POA: Insufficient documentation

## 2023-04-23 DIAGNOSIS — S62325A Displaced fracture of shaft of fourth metacarpal bone, left hand, initial encounter for closed fracture: Secondary | ICD-10-CM

## 2023-04-23 DIAGNOSIS — W19XXXA Unspecified fall, initial encounter: Secondary | ICD-10-CM | POA: Insufficient documentation

## 2023-04-23 DIAGNOSIS — F17210 Nicotine dependence, cigarettes, uncomplicated: Secondary | ICD-10-CM | POA: Insufficient documentation

## 2023-04-23 HISTORY — DX: Anemia, unspecified: D64.9

## 2023-04-23 HISTORY — PX: OPEN REDUCTION INTERNAL FIXATION (ORIF) METACARPAL: SHX6234

## 2023-04-23 SURGERY — OPEN REDUCTION INTERNAL FIXATION (ORIF) METACARPAL
Anesthesia: Monitor Anesthesia Care | Site: Finger | Laterality: Left

## 2023-04-23 MED ORDER — MIDAZOLAM HCL 2 MG/2ML IJ SOLN
2.0000 mg | Freq: Once | INTRAMUSCULAR | Status: AC
  Administered 2023-04-23: 2 mg via INTRAVENOUS

## 2023-04-23 MED ORDER — ROPIVACAINE HCL 5 MG/ML IJ SOLN
INTRAMUSCULAR | Status: DC | PRN
Start: 2023-04-23 — End: 2023-04-23
  Administered 2023-04-23: 30 mL via PERINEURAL

## 2023-04-23 MED ORDER — CEFAZOLIN SODIUM-DEXTROSE 2-4 GM/100ML-% IV SOLN
2.0000 g | INTRAVENOUS | Status: AC
  Administered 2023-04-23: 2 g via INTRAVENOUS

## 2023-04-23 MED ORDER — MIDAZOLAM HCL 2 MG/2ML IJ SOLN
INTRAMUSCULAR | Status: AC
  Filled 2023-04-23: qty 2

## 2023-04-23 MED ORDER — ONDANSETRON HCL 4 MG/2ML IJ SOLN: 4.0000 mg | Freq: Once | INTRAMUSCULAR | Status: DC | PRN

## 2023-04-23 MED ORDER — HYDROCODONE-ACETAMINOPHEN 5-325 MG PO TABS: ORAL_TABLET | ORAL | 0 refills | Status: AC

## 2023-04-23 MED ORDER — FENTANYL CITRATE (PF) 250 MCG/5ML IJ SOLN
INTRAMUSCULAR | Status: DC | PRN
  Administered 2023-04-23: 50 ug via INTRAVENOUS

## 2023-04-23 MED ORDER — PROPOFOL 10 MG/ML IV BOLUS
INTRAVENOUS | Status: DC | PRN
  Administered 2023-04-23: 20 mg via INTRAVENOUS

## 2023-04-23 MED ORDER — ROCURONIUM BROMIDE 10 MG/ML (PF) SYRINGE
PREFILLED_SYRINGE | INTRAVENOUS | Status: AC
  Filled 2023-04-23: qty 10

## 2023-04-23 MED ORDER — FENTANYL CITRATE (PF) 100 MCG/2ML IJ SOLN
INTRAMUSCULAR | Status: AC
  Filled 2023-04-23: qty 2

## 2023-04-23 MED ORDER — CEFAZOLIN SODIUM-DEXTROSE 2-4 GM/100ML-% IV SOLN
INTRAVENOUS | Status: AC
  Filled 2023-04-23: qty 100

## 2023-04-23 MED ORDER — OXYCODONE HCL 5 MG/5ML PO SOLN: 5.0000 mg | Freq: Once | ORAL | Status: DC | PRN

## 2023-04-23 MED ORDER — OXYCODONE HCL 5 MG PO TABS: 5.0000 mg | ORAL_TABLET | Freq: Once | ORAL | Status: DC | PRN

## 2023-04-23 MED ORDER — FENTANYL CITRATE (PF) 100 MCG/2ML IJ SOLN
100.0000 ug | Freq: Once | INTRAMUSCULAR | Status: AC
  Administered 2023-04-23: 100 ug via INTRAVENOUS

## 2023-04-23 MED ORDER — PROPOFOL 500 MG/50ML IV EMUL
INTRAVENOUS | Status: DC | PRN
Start: 2023-04-23 — End: 2023-04-23
  Administered 2023-04-23: 100 ug/kg/min via INTRAVENOUS

## 2023-04-23 MED ORDER — FENTANYL CITRATE (PF) 100 MCG/2ML IJ SOLN: 25.0000 ug | INTRAMUSCULAR | Status: DC | PRN

## 2023-04-23 MED ORDER — LIDOCAINE 2% (20 MG/ML) 5 ML SYRINGE
INTRAMUSCULAR | Status: DC | PRN
  Administered 2023-04-23: 60 mg via INTRAVENOUS

## 2023-04-23 MED ORDER — LACTATED RINGERS IV SOLN: INTRAVENOUS | Status: DC

## 2023-04-23 SURGICAL SUPPLY — 49 items
APL PRP STRL LF DISP 70% ISPRP (MISCELLANEOUS) ×1
BIT DRILL 1.1X60MM (BIT) IMPLANT
BLADE SURG 15 STRL LF DISP TIS (BLADE) ×2 IMPLANT
BLADE SURG 15 STRL SS (BLADE) ×2
BNDG CMPR 5X3 KNIT ELC UNQ LF (GAUZE/BANDAGES/DRESSINGS) ×1
BNDG CMPR 9X4 STRL LF SNTH (GAUZE/BANDAGES/DRESSINGS) ×1
BNDG ELASTIC 3INX 5YD STR LF (GAUZE/BANDAGES/DRESSINGS) ×1 IMPLANT
BNDG ESMARK 4X9 LF (GAUZE/BANDAGES/DRESSINGS) ×1 IMPLANT
BNDG GAUZE DERMACEA FLUFF 4 (GAUZE/BANDAGES/DRESSINGS) ×1 IMPLANT
BNDG GZE DERMACEA 4 6PLY (GAUZE/BANDAGES/DRESSINGS) ×1
CHLORAPREP W/TINT 26 (MISCELLANEOUS) ×1 IMPLANT
CORD BIPOLAR FORCEPS 12FT (ELECTRODE) ×1 IMPLANT
COVER BACK TABLE 60X90IN (DRAPES) ×1 IMPLANT
COVER MAYO STAND STRL (DRAPES) ×1 IMPLANT
CUFF TOURN SGL QUICK 18X4 (TOURNIQUET CUFF) ×1 IMPLANT
DRAPE EXTREMITY T 121X128X90 (DISPOSABLE) ×1 IMPLANT
DRAPE OEC MINIVIEW 54X84 (DRAPES) ×1 IMPLANT
DRAPE SURG 17X23 STRL (DRAPES) ×1 IMPLANT
DRILL 1.1X60MM (BIT) ×1
GAUZE SPONGE 4X4 12PLY STRL (GAUZE/BANDAGES/DRESSINGS) ×1 IMPLANT
GAUZE XEROFORM 1X8 LF (GAUZE/BANDAGES/DRESSINGS) ×1 IMPLANT
GLOVE BIO SURGEON STRL SZ7.5 (GLOVE) ×1 IMPLANT
GLOVE BIOGEL PI IND STRL 8 (GLOVE) ×1 IMPLANT
GOWN STRL REUS W/ TWL LRG LVL3 (GOWN DISPOSABLE) ×1 IMPLANT
GOWN STRL REUS W/TWL LRG LVL3 (GOWN DISPOSABLE) ×1
GOWN STRL REUS W/TWL XL LVL3 (GOWN DISPOSABLE) ×1 IMPLANT
NDL HYPO 25X1 1.5 SAFETY (NEEDLE) IMPLANT
NEEDLE HYPO 25X1 1.5 SAFETY (NEEDLE)
NS IRRIG 1000ML POUR BTL (IV SOLUTION) ×1 IMPLANT
PACK BASIN DAY SURGERY FS (CUSTOM PROCEDURE TRAY) ×1 IMPLANT
PAD CAST 4YDX4 CTTN HI CHSV (CAST SUPPLIES) ×1 IMPLANT
PADDING CAST COTTON 4X4 STRL (CAST SUPPLIES) ×1
SCREW NL 1.5X12 (Screw) IMPLANT
SLEEVE SCD COMPRESS KNEE MED (STOCKING) IMPLANT
SLING ARM FOAM STRAP XLG (SOFTGOODS) IMPLANT
SPLINT PLASTER CAST XFAST 3X15 (CAST SUPPLIES) IMPLANT
SPLINT PLASTER CAST XFAST 4X15 (CAST SUPPLIES) IMPLANT
STOCKINETTE 4X48 STRL (DRAPES) ×1 IMPLANT
SUT CHROMIC 4 0 PS 2 18 (SUTURE) ×1 IMPLANT
SUT ETHILON 3 0 PS 1 (SUTURE) IMPLANT
SUT ETHILON 4 0 PS 2 18 (SUTURE) ×1 IMPLANT
SUT MERSILENE 4 0 P 3 (SUTURE) IMPLANT
SUT VIC AB 3-0 PS1 18 (SUTURE)
SUT VIC AB 3-0 PS1 18XBRD (SUTURE) IMPLANT
SUT VIC AB 4-0 PS2 18 (SUTURE) ×1 IMPLANT
SYR BULB EAR ULCER 3OZ GRN STR (SYRINGE) ×1 IMPLANT
SYR CONTROL 10ML LL (SYRINGE) IMPLANT
TOWEL GREEN STERILE FF (TOWEL DISPOSABLE) ×2 IMPLANT
UNDERPAD 30X36 HEAVY ABSORB (UNDERPADS AND DIAPERS) ×1 IMPLANT

## 2023-04-23 NOTE — Anesthesia Postprocedure Evaluation (Signed)
Anesthesia Post Note  Patient: Ryan Cooper  Procedure(s) Performed: OPEN REDUCTION INTERNAL FIXATION (ORIF) LEFT FOURTH METACARPAL (Left: Finger)     Patient location during evaluation: PACU Anesthesia Type: Regional and MAC Level of consciousness: awake and alert and oriented Pain management: pain level controlled Vital Signs Assessment: post-procedure vital signs reviewed and stable Respiratory status: spontaneous breathing, nonlabored ventilation and respiratory function stable Cardiovascular status: stable and blood pressure returned to baseline Postop Assessment: no apparent nausea or vomiting Anesthetic complications: no   No notable events documented.  Last Vitals:  Vitals:   04/23/23 1100 04/23/23 1114  BP: 128/75 (!) 132/98  Pulse: 68 71  Resp: 15 18  Temp:  36.4 C  SpO2: 96% 96%    Last Pain:  Vitals:   04/23/23 1114  TempSrc: Temporal  PainSc: 0-No pain                 Joliana Claflin A.

## 2023-04-23 NOTE — Op Note (Signed)
NAME: Ryan Cooper MEDICAL RECORD NO: [id number] DATE OF BIRTH: 1991/05/08 FACILITY: [facility] LOCATION: [facility] PHYSICIAN: Tami Ribas, MD   OPERATIVE REPORT   DATE OF PROCEDURE: 04/23/23    PREOPERATIVE DIAGNOSIS: Left ring finger metacarpal shaft fracture   POSTOPERATIVE DIAGNOSIS: Left ring finger metacarpal shaft fracture   PROCEDURE: Open reduction internal fixation left ring finger metacarpal shaft fracture   SURGEON:  Betha Loa, M.D.   ASSISTANT: Cindee Salt, MD   ANESTHESIA:  Regional with sedation   INTRAVENOUS FLUIDS:  Per anesthesia flow sheet.   ESTIMATED BLOOD LOSS:  Minimal.   COMPLICATIONS:  None.   SPECIMENS:  none   TOURNIQUET TIME:    Total Tourniquet Time Documented: Upper Arm (Left) - 38 minutes Total: Upper Arm (Left) - 38 minutes    DISPOSITION:  Stable to PACU.   INDICATIONS: 32 year old male states approximately 3 weeks ago he injured his left ring finger and a fall.  Seen at urgent care where radiographs were taken revealing a metacarpal shaft fracture.  He was splinted and follow-up in the office.  He wishes to proceed with operative reduction and fixation.  Risks, benefits and alternatives of surgery were discussed including the risks of blood loss, infection, damage to nerves, vessels, tendons, ligaments, bone for surgery, need for additional surgery, complications with wound healing, continued pain, stiffness, , nonunion, malunion.  He voiced understanding of these risks and elected to proceed.  OPERATIVE COURSE:  After being identified preoperatively by myself,  the patient and I agreed on the procedure and site of the procedure.  The surgical site was marked.  Surgical consent had been signed. Preoperative IV antibiotic prophylaxis was given. He was transferred to the operating room and placed on the operating table in supine position with the Left upper extremity on an arm board.  Sedation was induced by the  anesthesiologist. A regional block had been performed by anesthesia in preoperative holding.    Left upper extremity was prepped and draped in normal sterile orthopedic fashion.  A surgical pause was performed between the surgeons, anesthesia, and operating room staff and all were in agreement as to the patient, procedure, and site of procedure.  Tourniquet at the proximal aspect of the extremity was inflated to 250 mmHg after exsanguination of the arm with an Esmarch bandage.  Incision was made longitudinally over the ring finger metacarpal.  This was carried in subcutaneous tissues by spreading technique.  Bipolar electrocautery was used to obtain hemostasis.  The interval between the [facility] tendons to the ring and small fingers was made.  The periosteum was sharply incised.  The fracture site was identified.  Was cleared of soft tissue and early callus formation.  It was reduced under direct visualization and provisionally held with bone clamps.  C-arm was used in AP and lateral projections to ensure appropriate reduction which was the case.  The wrist was placed routine Deasis and there is no scissoring.  The ALPS set was used.  Standard AO drilling and measuring technique was used.  3 screws were placed across the fracture.  There was good purchase.  There was good reduction of the fracture.  The wrist was placed through tenodesis and there is no scissoring.  C-arm was used in AP lateral oblique projections to ensure appropriate reduction position of hardware which was the case.  The wound was copiously irrigated with sterile saline.  The periosteum was repaired with a running 4-0 Vicryl suture.  Skin was closed with  4-0 nylon in a horizontal mattress fashion.  The wound was dressed with sterile Xeroform 4 x 4's and wrapped with a Kerlix bandage.  Volar and dorsal slab splint including the long ring and small fingers was placed with the MPs flexed and the MPs extended.  This was wrapped with Kerlix and Ace  bandage.  The tourniquet was deflated at 38 minutes.  Fingertips were pink with brisk capillary refill after deflation of tourniquet.  The operative  drapes were broken down.  The patient was awoken from anesthesia safely.  He was transferred back to the stretcher and taken to PACU in stable condition.  I will see him back in the office in 1 week for postoperative followup.  I will give him a prescription for Norco 5/325 1-2 tabs PO q6 hours prn pain, dispense # 20.   Betha Loa, MD Electronically signed, 04/23/23

## 2023-04-23 NOTE — Transfer of Care (Signed)
Immediate Anesthesia Transfer of Care Note  Patient: Ryan Cooper  Procedure(s) Performed: OPEN REDUCTION INTERNAL FIXATION (ORIF) LEFT FOURTH METACARPAL (Left: Finger)  Patient Location: PACU  Anesthesia Type:General  Level of Consciousness: drowsy  Airway & Oxygen Therapy: Patient Spontanous Breathing and Patient connected to face mask oxygen  Post-op Assessment: Report given to RN and Post -op Vital signs reviewed and stable  Post vital signs: Reviewed and stable  Last Vitals:  Vitals Value Taken Time  BP 114/76 04/23/23 1033  Temp    Pulse 68 04/23/23 1034  Resp 15 04/23/23 1034  SpO2 99 % 04/23/23 1034  Vitals shown include unfiled device data.  Last Pain:  Vitals:   04/23/23 0812  TempSrc: Oral  PainSc: 0-No pain      Patients Stated Pain Goal: 3 (04/23/23 1610)  Complications: No notable events documented.

## 2023-04-23 NOTE — Discharge Instructions (Addendum)
 Hand Center Instructions Hand Surgery  Wound Care: Keep your hand elevated above the level of your heart.  Do not allow it to dangle by your side.  Keep the dressing dry and do not remove it unless your doctor advises you to do so.  He will usually change it at the time of your post-op visit.  Moving your fingers is advised to stimulate circulation but will depend on the site of your surgery.  If you have a splint applied, your doctor will advise you regarding movement.  Activity: Do not drive or operate machinery today.  Rest today and then you may return to your normal activity and work as indicated by your physician.  Diet:  Drink liquids today or eat a light diet.  You may resume a regular diet tomorrow.    General expectations: Pain for two to three days. Fingers may become slightly swollen.  Call your doctor if any of the following occur: Severe pain not relieved by pain medication. Elevated temperature. Dressing soaked with blood. Inability to move fingers. White or bluish color to fingers.    Post Anesthesia Home Care Instructions  Activity: Get plenty of rest for the remainder of the day. A responsible individual must stay with you for 24 hours following the procedure.  For the next 24 hours, DO NOT: -Drive a car -Advertising copywriter -Drink alcoholic beverages -Take any medication unless instructed by your physician -Make any legal decisions or sign important papers.  Meals: Start with liquid foods such as gelatin or soup. Progress to regular foods as tolerated. Avoid greasy, spicy, heavy foods. If nausea and/or vomiting occur, drink only clear liquids until the nausea and/or vomiting subsides. Call your physician if vomiting continues.  Special Instructions/Symptoms: Your throat may feel dry or sore from the anesthesia or the breathing tube placed in your throat during surgery. If this causes discomfort, gargle with warm salt water. The discomfort should disappear  within 24 hours.  If you had a scopolamine patch placed behind your ear for the management of post- operative nausea and/or vomiting:  1. The medication in the patch is effective for 72 hours, after which it should be removed.  Wrap patch in a tissue and discard in the trash. Wash hands thoroughly with soap and water. 2. You may remove the patch earlier than 72 hours if you experience unpleasant side effects which may include dry mouth, dizziness or visual disturbances. 3. Avoid touching the patch. Wash your hands with soap and water after contact with the patch.   Regional Anesthesia Blocks  1. You may not be able to move or feel the "blocked" extremity after a regional anesthetic block. This may last may last from 3-48 hours after placement, but it will go away. The length of time depends on the medication injected and your individual response to the medication. As the nerves start to wake up, you may experience tingling as the movement and feeling returns to your extremity. If the numbness and inability to move your extremity has not gone away after 48 hours, please call your surgeon.   2. The extremity that is blocked will need to be protected until the numbness is gone and the strength has returned. Because you cannot feel it, you will need to take extra care to avoid injury. Because it may be weak, you may have difficulty moving it or using it. You may not know what position it is in without looking at it while the block is in effect.  3. For blocks in the legs and feet, returning to weight bearing and walking needs to be done carefully. You will need to wait until the numbness is entirely gone and the strength has returned. You should be able to move your leg and foot normally before you try and bear weight or walk. You will need someone to be with you when you first try to ensure you do not fall and possibly risk injury.  4. Bruising and tenderness at the needle site are common side effects  and will resolve in a few days.  5. Persistent numbness or new problems with movement should be communicated to the surgeon or the [facility] [phone] [facility] [phone]).

## 2023-04-23 NOTE — Op Note (Signed)
I assisted Surgeons and Role:    * Betha Loa, MD - Primary    Cindee Salt, MD - Assisting on the Procedure(s): OPEN REDUCTION INTERNAL FIXATION (ORIF) LEFT FOURTH METACARPAL on 04/23/2023.  I provided assistance on this case as follows: Set up, approach, reduction for identification of the fracture, debridement of the fracture, reduction and stabilization followed by fixation of the fracture with screws, closure of the wound and application of the dressing and splints.  Electronically signed by: Cindee Salt, MD Date: 04/23/2023 Time: 10:28 AM

## 2023-04-23 NOTE — H&P (Signed)
Ryan Cooper is an 32 y.o. male.   Chief Complaint: metacarpal fracture HPI: 32 yo male states he injured left hand in a fall 3 weeks ago.  Seen at [facility] where XR revealed metacarpal fracture.  Referred for further care.  He wishes to proceed with operative reduction and fixation.  Allergies: No Known Allergies  Past Medical History:  Diagnosis Date   Anemia    Medical history non-contributory     Past Surgical History:  Procedure Laterality Date   NO PAST SURGERIES     ORIF MANDIBULAR FRACTURE Left 09/16/2019   Procedure: OPEN REDUCTION INTERNAL FIXATION (ORIF) left MANDIBULAR FRACTURE AND EXTRACTION OF 217;  Surgeon: Lovena Neighbours, MD;  Location: [facility] OR;  Service: Plastics;  Laterality: Left;  NASAL INTUBATION   ORIF PELVIC FRACTURE WITH PERCUTANEOUS SCREWS Right 04/05/2020   Procedure: ORIF PELVIC FRACTURE WITH SI JOINT FUSION;  Surgeon: Myrene Galas, MD;  Location: [facility] OR;  Service: Orthopedics;  Laterality: Right;    Family History: History reviewed. No pertinent family history.  Social History:   reports that he has been smoking cigarettes. He has a 5 pack-year smoking history. He has never used smokeless tobacco. He reports current drug use. Frequency: 1.00 time per week. Drug: Marijuana. He reports that he does not drink alcohol.  Medications: Medications Prior to Admission  Medication Sig Dispense Refill   baclofen (LIORESAL) 10 MG tablet Take 10 mg by mouth 3 (three) times daily.     ibuprofen (ADVIL) 600 MG tablet Take 600 mg by mouth every 6 (six) hours as needed.      No results found for this or any previous visit (from the past 48 hour(s)).  DG MINI C-ARM IMAGE ONLY  Result Date: 04/23/2023 There is no interpretation for this exam.  This order is for images obtained during a surgical procedure.  Please See "Surgeries" Tab for more information regarding the procedure.      Blood pressure (!) 149/99, temperature 98.1 F (36.7 C), temperature source Oral,  resp. rate 17, height 5\' 11"  (1.803 m), weight 68.4 kg, SpO2 100%.  General appearance: alert, cooperative, and appears stated age Head: Normocephalic, without obvious abnormality, atraumatic Neck: supple, symmetrical, trachea midline Extremities: Intact sensation and capillary refill all digits.  +epl/fpl/io.  No wounds.  Pulses: 2+ and symmetric Skin: Skin color, texture, turgor normal. No rashes or lesions Neurologic: Grossly normal Incision/Wound: none  Assessment/Plan Left ring finger metacarpal fracture.  Non operative and operative treatment options have been discussed with the patient and patient wishes to proceed with operative treatment. Risks, benefits and alternatives of surgery were discussed including risks of blood loss, infection, damage to nerves/vessels/tendons/ligament/bone, failure of surgery, need for additional surgery, complication with wound healing, stiffness, nonunion, malunion.  He voiced understanding of these risks and elected to proceed.    Betha Loa 04/23/2023, 9:10 AM

## 2023-04-23 NOTE — Anesthesia Preprocedure Evaluation (Signed)
Anesthesia Evaluation  Patient identified by MRN, date of birth, ID band Patient awake    Reviewed: Allergy & Precautions, NPO status , Patient's Chart, lab work & pertinent test results  Airway Mallampati: II  TM Distance: >3 FB Neck ROM: Full    Dental  (+) Chipped,    Pulmonary Current Smoker and Patient abstained from smoking.   Pulmonary exam normal breath sounds clear to auscultation       Cardiovascular negative cardio ROS Normal cardiovascular exam Rhythm:Regular Rate:Normal     Neuro/Psych negative neurological ROS  negative psych ROS   GI/Hepatic negative GI ROS,,,(+)     substance abuse  marijuana use  Endo/Other  negative endocrine ROS    Renal/GU negative Renal ROS  negative genitourinary   Musculoskeletal Left 4th metacarpal Fx Hx/o Pelvic Fx 2021    Abdominal   Peds  Hematology  (+) Blood dyscrasia, anemia   Anesthesia Other Findings   Reproductive/Obstetrics                              Anesthesia Physical Anesthesia Plan  ASA: 2  Anesthesia Plan: MAC and Regional   Post-op Pain Management: Regional block* and Minimal or no pain anticipated   Induction: Intravenous  PONV Risk Score and Plan: 1 and Treatment may vary due to age or medical condition, Midazolam, Propofol infusion and Ondansetron  Airway Management Planned: Natural Airway and Simple Face Mask  Additional Equipment: None  Intra-op Plan:   Post-operative Plan:   Informed Consent: I have reviewed the patients History and Physical, chart, labs and discussed the procedure including the risks, benefits and alternatives for the proposed anesthesia with the patient or authorized representative who has indicated his/her understanding and acceptance.     Dental advisory given  Plan Discussed with: Anesthesiologist and CRNA  Anesthesia Plan Comments:          Anesthesia Quick Evaluation

## 2023-04-23 NOTE — Anesthesia Procedure Notes (Signed)
Anesthesia Regional Block: Supraclavicular block   Pre-Anesthetic Checklist: , timeout performed,  Correct Patient, Correct Site, Correct Laterality,  Correct Procedure, Correct Position, site marked,  Risks and benefits discussed,  Surgical consent,  Pre-op evaluation,  At surgeon's request and post-op pain management  Laterality: Left  Prep: chloraprep       Needles:  Injection technique: Single-shot  Needle Type: Echogenic Stimulator Needle     Needle Length: 10cm  Needle Gauge: 21   Needle insertion depth: 6 cm   Additional Needles:   Procedures:,,,, ultrasound used (permanent image in chart),,   Motor weakness within 5 minutes.  Narrative:  Start time: 04/23/2023 9:16 AM End time: 04/23/2023 9:21 AM Injection made incrementally with aspirations every 5 mL.  Performed by: Personally  Anesthesiologist: Mal Amabile, MD  Additional Notes: Timeout performed. Patient sedated. Relevant anatomy ID'd using [place]. Incremental 2-36ml injection of LA with frequent aspiration. Patient tolerated procedure well.

## 2023-04-23 NOTE — Progress Notes (Signed)
Assisted Dr. Foster with left, supraclavicular, ultrasound guided block. Side rails up, monitors on throughout procedure. See vital signs in flow sheet. Tolerated Procedure well. 

## 2023-04-24 ENCOUNTER — Encounter (HOSPITAL_BASED_OUTPATIENT_CLINIC_OR_DEPARTMENT_OTHER): Payer: Self-pay | Admitting: Orthopedic Surgery

## 2024-09-17 ENCOUNTER — Other Ambulatory Visit: Payer: Self-pay

## 2024-09-17 ENCOUNTER — Emergency Department (HOSPITAL_BASED_OUTPATIENT_CLINIC_OR_DEPARTMENT_OTHER)
Admission: EM | Admit: 2024-09-17 | Discharge: 2024-09-18 | Disposition: A | Discharge status: Home / Self Care | Attending: Emergency Medicine | Admitting: Emergency Medicine

## 2024-09-17 ENCOUNTER — Encounter (HOSPITAL_BASED_OUTPATIENT_CLINIC_OR_DEPARTMENT_OTHER): Payer: Self-pay

## 2024-09-17 DIAGNOSIS — L723 Sebaceous cyst: Secondary | ICD-10-CM | POA: Diagnosis present

## 2024-09-17 NOTE — ED Triage Notes (Signed)
 Pt c/o cyst or boil on my face x3 days, it's hurting. Pt w approx 1.5cm cyst to L cheek

## 2024-09-18 MED ORDER — DOXYCYCLINE HYCLATE 100 MG PO CAPS: 100.0000 mg | ORAL_CAPSULE | Freq: Two times a day (BID) | ORAL | 0 refills | Status: AC

## 2024-09-18 MED ORDER — DOXYCYCLINE HYCLATE 100 MG PO TABS
100.0000 mg | ORAL_TABLET | Freq: Once | ORAL | Status: AC
  Administered 2024-09-18: 100 mg via ORAL
  Filled 2024-09-18: qty 1

## 2024-09-18 MED ORDER — LIDOCAINE-EPINEPHRINE-TETRACAINE (LET) TOPICAL GEL
3.0000 mL | TOPICAL | Status: DC
  Administered 2024-09-18: 3 mL

## 2024-09-18 NOTE — ED Provider Notes (Signed)
 " [place] EMERGENCY DEPARTMENT AT [facility] Provider Note   CSN: [id number] Arrival date & time: 09/17/24  2220     Patient presents with: Cyst   Ryan Cooper is a 34 y.o. male.   HPI     This is a 34 year old male who presents with concern for a boil on his face.  Patient reports 2 to 3-day history of worsening pain and swelling on the left side of his face.  He does report that he has had boils before but not in the specific area.  No fevers.  No difficulty swallowing.  Has not noted any spontaneous drainage.  Did put some fat back on the area which seem to help bring it to a head. Prior to Admission medications  Medication Sig Start Date End Date Taking? Authorizing Provider  doxycycline  (VIBRAMYCIN ) 100 MG capsule Take 1 capsule (100 mg total) by mouth 2 (two) times daily. 09/18/24  Yes Sehar Sedano, Charmaine FALCON, MD  baclofen (LIORESAL) 10 MG tablet Take 10 mg by mouth 3 (three) times daily.    [provider]  HYDROcodone -acetaminophen  (NORCO) 5-325 MG tablet 1-2 tabs po q6 hours prn pain 04/23/23   Kuzma, Kevin, MD  ibuprofen  (ADVIL ) 600 MG tablet Take 600 mg by mouth every 6 (six) hours as needed.    [provider]    Allergies: Patient has no known allergies.    Review of Systems  Constitutional:  Negative for fever.  Skin:        Cyst/abscess  All other systems reviewed and are negative.   Updated Vital Signs BP 118/81   Pulse 70   Temp 98.4 F (36.9 C)   Resp 18   SpO2 100%   Physical Exam Vitals and nursing note reviewed.  Constitutional:      Appearance: He is well-developed.  HENT:     Head: Normocephalic and atraumatic.     Comments: Well-circumscribed 1.5 cm cystic lesion over the left cheek at the zygoma, fluctuant but not indurated, no overlying skin changes Eyes:     Pupils: Pupils are equal, round, and reactive to light.  Cardiovascular:     Rate and Rhythm: Normal rate and regular rhythm.  Pulmonary:     Effort:  Pulmonary effort is normal. No respiratory distress.  Abdominal:     Palpations: Abdomen is soft.     Tenderness: There is no abdominal tenderness.  Musculoskeletal:     Cervical back: Neck supple.  Lymphadenopathy:     Cervical: No cervical adenopathy.  Skin:    General: Skin is warm and dry.  Neurological:     Mental Status: He is alert and oriented to person, place, and time.  Psychiatric:        Mood and Affect: Mood normal.     (all labs ordered are listed, but only abnormal results are displayed) Labs Reviewed - No data to display  EKG: None  Radiology: No results found.   .Incision and Drainage  Date/Time: 09/18/2024 2:51 AM  Performed by: Bari Charmaine FALCON, MD Authorized by: Bari Charmaine FALCON, MD   Consent:    Consent obtained:  Verbal   Consent given by:  Patient   Risks discussed:  Bleeding, incomplete drainage, pain and infection Universal protocol:    Patient identity confirmed:  Verbally with patient Location:    Type:  Cyst   Location:  Head   Head location:  Face Pre-procedure details:    Skin preparation:  Povidone-iodine  Anesthesia:    Anesthesia  method:  Topical application   Topical anesthetic:  3 mL lidocaine -EPINEPHrine -tetracaine  Procedure type:    Complexity:  Simple Procedure details:    Ultrasound guidance: no     Needle aspiration: yes     Needle size:  18 G   Incision types:  Stab incision and single straight   Incision depth:  Dermal   Wound management:  Probed and deloculated   Drainage:  Purulent   Drainage amount:  Moderate   Wound treatment:  Wound left open   Packing materials:  None Post-procedure details:    Procedure completion:  Tolerated Comments:     Initially attempted needle drainage given location.  Suspect sebaceous cyst.  Given thickness of contents, this was widened to a 0.25 cm incision.  Purulent drainage was expressed.    Medications Ordered in the ED  doxycycline  (VIBRA -TABS) tablet 100 mg (100 mg  Oral Given 09/18/24 0048)                                    Medical Decision Making Risk Prescription drug management.   This patient presents to the ED for concern of cyst, this involves an extensive number of treatment options, and is a complaint that carries with it a high risk of complications and morbidity.  I considered the following differential and admission for this acute, potentially life threatening condition.  The differential diagnosis includes cyst, abscess, cellulitis  MDM:    This is a 33 year old male who presents with cystic like structure on his face.  Clinically suspicious for sebaceous cyst.  No significant induration.  Needle drainage was extended by scalpel in order to drain appropriately.  I did discuss with the patient that he may need to have the cyst excised as it will likely reaccumulate.  He was given the name of local dermatologist.  Was placed on antibiotics given location on the face.  (Labs, imaging, consults)  Labs: I Ordered, and personally interpreted labs.  The pertinent results include: N/A  Imaging Studies ordered: I ordered imaging studies including N/A I independently visualized and interpreted imaging. I agree with the radiologist interpretation  Additional history obtained from chart review.  External records from outside source obtained and reviewed including prior evaluations  Cardiac Monitoring: The patient was not maintained on a cardiac monitor.  If on the cardiac monitor, I personally viewed and interpreted the cardiac monitored which showed an underlying rhythm of: N/A  Reevaluation: After the interventions noted above, I reevaluated the patient and found that they have :stayed the same  Social Determinants of Health:  lives independently  Disposition: Discharge  Co morbidities that complicate the patient evaluation  Past Medical History:  Diagnosis Date   Anemia    Medical history non-contributory      Medicines Meds  ordered this encounter  Medications   doxycycline  (VIBRA -TABS) tablet 100 mg   doxycycline  (VIBRAMYCIN ) 100 MG capsule    Sig: Take 1 capsule (100 mg total) by mouth 2 (two) times daily.    Dispense:  20 capsule    Refill:  0    I have reviewed the patients home medicines and have made adjustments as needed  Problem List / ED Course: Problem List Items Addressed This Visit   None Visit Diagnoses       Sebaceous cyst    -  Primary  Final diagnoses:  Sebaceous cyst    ED Discharge Orders          Ordered    doxycycline  (VIBRAMYCIN ) 100 MG capsule  2 times daily        09/18/24 0040               Ryan Cooper, Charmaine FALCON, MD 09/18/24 0255  "

## 2024-09-18 NOTE — Discharge Instructions (Signed)
 You were seen today and likely have a sebaceous cyst of the face.  This may reaccumulate.  Apply warm compresses and take antibiotics.  Once the cyst has become less inflamed, you need to follow-up with a dermatologist for definitive removal.
